# Patient Record
Sex: Female | Born: 1970 | Race: White | Hispanic: No | Marital: Married | Smoking: Heavy tobacco smoker
Health system: Southern US, Community
[De-identification: ages and names within clinical notes are randomized; demographics above are authoritative.]

## PROBLEM LIST (undated history)

## (undated) DIAGNOSIS — J449 Chronic obstructive pulmonary disease, unspecified: Secondary | ICD-10-CM

## (undated) DIAGNOSIS — J45909 Unspecified asthma, uncomplicated: Secondary | ICD-10-CM

## (undated) HISTORY — DX: Unspecified asthma, uncomplicated: J45.909

## (undated) HISTORY — DX: Chronic obstructive pulmonary disease, unspecified: J44.9

---

## 2001-01-05 ENCOUNTER — Emergency Department (HOSPITAL_COMMUNITY): Admission: EM | Admit: 2001-01-05 | Discharge: 2001-01-05 | Payer: Self-pay | Admitting: Emergency Medicine

## 2007-04-08 ENCOUNTER — Inpatient Hospital Stay (HOSPITAL_COMMUNITY): Admission: RE | Admit: 2007-04-08 | Discharge: 2007-04-15 | Payer: Self-pay | Admitting: Psychiatry

## 2007-04-08 ENCOUNTER — Ambulatory Visit: Payer: Self-pay | Admitting: Psychiatry

## 2010-04-20 ENCOUNTER — Encounter: Payer: Self-pay | Admitting: Obstetrics and Gynecology

## 2010-08-13 NOTE — H&P (Signed)
[name], Nicole Cohen NO.:  [id number]   MEDICAL RECORD NO.:  [id number]          PATIENT TYPE:  IPS   LOCATION:  0506                          FACILITY:  [facility]   PHYSICIAN:  Geoffery Lyons, M.D.      DATE OF BIRTH:  1971-02-18   DATE OF ADMISSION:  04/08/2007  DATE OF DISCHARGE:                       PSYCHIATRIC ADMISSION ASSESSMENT   IDENTIFYING DATA:  This is a 40 year old female who was voluntarily  admitted on April 08, 2007.   HISTORY OF PRESENT ILLNESS:  The patient presents with history of  depression including suicidal thoughts.  No specific plan.  The patient  has been using Ativan, Klonopin up to 15 to 20 pills daily.  She is  unclear of the milligrams and also has been drinking.  Her last use of  the benzodiazepines and alcohol was 6 days ago.  She states that she was  feeling very panicky about being evicted so she took the kids and ran  to [place], where she was staying with her best friend.  She was  very concerned about losing her home, again was having panic.  She  states that she was on a pill and alcohol binge.  States she did have  the kids with her but her friend who she states is a Engineer, civil (consulting), is watching  him.  She states her family was unaware of her whereabouts for  approximately 2 weeks.  States she has been drinking a lot up to six  bottles a liquor, drinking on a daily basis.  She has had trouble  sleeping; has lost approximately 15 pounds over the past 2 months.  Reports a history of alcohol use for years with her first drink at the  age of 52.  Her family pretty much has disowned her due to these events  and taking the children.   PAST PSYCHIATRIC HISTORY:  First admission to the [facility].  She reports a history of postpartum depression where she was  having symptoms of anxiety, feeling very sad and crying.  She reports a  history of suicide attempts 3 years ago where she cut her wrists after a  nervous breakdown.   She was being treated prior at [facility].   SOCIAL HISTORY:  She considers herself homeless at this time.  Has poor  social support.  She is a married female with 3 children ages 72, 1 and 65  months of age.   FAMILY HISTORY:  Father with alcohol problems but she has not seen her  father since childhood.  States feels her mother has history of  depression.  Her sister is bipolar.   ALCOHOL AND DRUG HISTORY:  As above.  Her first drink was at the age of  89.  Her longest of sobriety has during her pregnancy, although she did  state that she did drink on occasion during her pregnancy.  Primary care  Jacoria Keiffer goes to [facility].   MEDICAL PROBLEMS:  Cervical cancer, did receive some treatment.  Also  reporting problems with headaches.   MEDICATIONS:  Was prescribed Xanax and  Ativan which the patient states  she was taking like candy.   ALLERGIES:  DRUG ALLERGIES ARE ASPIRIN.   REVIEW OF SYSTEMS:  Physical exam, review of systems is significant for  a 15 pounds weight loss.  No seizures.  No falls, positive for  depression, anxiety and substance use.   PHYSICAL EXAMINATION:  VITAL SIGNS:  Temperature is 97.7, 88 heart rate,  20 respirations, blood pressure is 113/77.  She is 5 feet 2 inches tall  and 102 pounds.  GENERAL:  This is a thin female.  She appears in no acute physical  distress.  There are no tremors.  HEENT:  Her head is atraumatic.  NECK:  Negative lymphadenopathy.  CHEST:  Chest is clear, did have some mild rhonchi that clear with  cough.  BREASTS:  Breast exam was deferred.  HEART:  Heart regular rate and rhythm.  ABDOMEN:  Her abdomen is soft and flat.  PELVIC/GU:  Exam deferred.  EXTREMITIES:  She was moves all extremities.  No clubbing, no acute  edema.  No deformities.  Strong at 5+ against resistance.  SKIN:  Warm, no rashes or lacerations.  SKIN:  Skin assessment notes piercings.  NEUROLOGICAL:  Findings are intact and there are  no tics no tremors.   LABORATORY DATA:  TSH is 1.517, magnesium 2.1, __________  within normal  limits.  cm at within normal limits.  Salicylate less than 4.  Urine,  urine drug screen and pregnancy tests are pending.   MENTAL STATUS EXAM:  She is thin young female.  She is casually dressed  with fair eye contact.  Speech is clear, normal pace and tone.  The  patient's mood is depressed.  Her affect she is a tearful, cooperative,  sad.  Thought processes endorsing suicidal thoughts.  No hallucinations.  Her answers are goal directed.  Cognitive function intact.  Her memory  is good.  Judgment and insight poor.  Poor impulse control.   DIAGNOSES:  AXIS I:  Major depressive disorder, alcohol dependence,  benzoyl dependence.  AXIS II:  Deferred.  AXIS III:  Asthma.  AXIS IV:  Problems with primary support group, possible problems with  housing, economic issues, other psychosocial problems.  AXIS V:  Current is 35.   PLAN:  Stabilize mood and thinking, detox with Librium protocol.  Work  on relapse prevention.  Will assess comorbidities during her hospital  stay.  We will attempt to obtain the patient's prior psychotropic  medications, increase her coping skills, family session with husband if  husband is agreeable.  Case manager is looking any potential rehab  programs.  The patient is agreeable to the inpatient rehab at this time.  Her tentative length of stay is 4 to 5 days.      Landry Corporal, N.P.      Geoffery Lyons, M.D.  Electronically Signed    JO/MEDQ  D:  04/09/2007  T:  04/10/2007  Job:  [id number]

## 2010-08-16 NOTE — Discharge Summary (Signed)
NAMENICOL, USREY NO.:  [id number]   MEDICAL RECORD NO.:  [id number]          PATIENT TYPE:  IPS   LOCATION:  0506                          FACILITY:  [facility]   PHYSICIAN:  Geoffery Lyons, M.D.      DATE OF BIRTH:  1971-03-09   DATE OF ADMISSION:  04/08/2007  DATE OF DISCHARGE:  04/15/2007                               DISCHARGE SUMMARY   CHIEF COMPLAINT AND PRESENT ILLNESS:  This was the first admission to  [facility] for this 40 year old female voluntarily  admitted.  History of depression, including suicidal thoughts.  No  specific plan.  Had been using Ativan and Klonopin, up to 15-20 pills  per day.  She has also been drinking.  Last use of benzodiazepines and  alcohol was 6 days prior to this admission.  She had been feeling very  panicky about being evicted, so she took the kids and ran to [place], where she was staying with her best friend.  She was very  concerned about losing her home again and was having panic.  She was on  a pill and alcohol binge.  She did have the kids with her, but a friend  who she states is a Engineer, civil (consulting) was watching them.  Her family was unaware of  her whereabouts for 2 weeks.  She has been drinking a lot, up to 6  bottles of liquor, drinking on a daily basis.  Has had trouble sleeping,  lost 15 pounds over 2 months.  Alcohol for years, first drink at age 71.  Her family pretty much has disowned her due to these events and taking  the children.   PAST PSYCHIATRIC HISTORY:  First time at Behavior Health.  Has history  of postpartum depression, with anxiety, feeling very sad and crying.  Suicide attempt 3 years prior to this admission, where she cut her  wrist.  She was being treated at the [facility].   ALCOHOL AND DRUG HISTORY:  As already stated, first drink at age 71.  Longest sobriety during her pregnancy.   MEDICAL HISTORY:  Cervical cancer.   MEDICATIONS:  Was prescribed Xanax and Ativan, which she  admits she took  like candy.   PHYSICAL EXAMINATION:  Performed, failed to show any acute findings.   LABORATORY WORK:  White blood cells 10.2, hemoglobin 14.7, mean  corpuscular volume 83.8.  Sodium 138, potassium 3.7, glucose 92, BUN 11,  creatinine 0.69, SGOT 15, SGPT 11, TSH 1.517.   MENTAL STATUS EXAM:  Reveals an alert cooperative female, casually  dressed, fair eye contact.  Speech was clear, normal pace and tone.  Mood is anxious, depressed.  Affect anxious, depressed, very tearful.  Thought processes logical, coherent, and relevant.  Endorsed feeling  overwhelmed, hopeless, helpless.  Endorsed suicidal thoughts, but no  delusions.  No hallucinations.  Cognition well preserved.   ADMITTING DIAGNOSES:   AXIS I:  Major depressive disorder.  Alcohol dependence.  Benzodiazepine dependence.   AXIS II:  No diagnosis.   AXIS III:  Asthma.   AXIS IV:  Moderate.   AXIS  V:  On admission, 35.  Highest global assessment of function in the  last year 60.   COURSE IN THE HOSPITAL:  She was admitted and started individual and  group psychotherapy.  We detoxified her with Librium.  We gave her some  trazodone for sleep.  She was given Paxil, Robaxin, and placed on a  Lidoderm patch.  As already stated, endorsed that the depression got  really bad, got very depressed, had two babies back to back.  Through  the pregnancy depressed, but it got worse after she gave birth.  Increased depression, endorsed paranoia.  Endorsed she was going to her  house, so she took the kids to [place].  She was on a binge.  Monday of this week, got back to this area, drinking about 6 bottles of  liquor, using Ativan, Valium, Klonopin 15-20 pills.  Came home, crying,  decreased sleep.  Three years ago, she broke down, tried to kill  herself, cut her wrists.  Endorsed that there were issues with the  husband's family.  Started drinking at 50.  She stopped when she found  out she was pregnant.  Se  was in mental health 3 years prior to this  admission in [employer] for counseling and medications for panic.  Family session with the husband April 10, 2007.  The family was  currently homeless.  The husband was going to work on finding a place  for the whole family, even while he was staying with his family, but she  was not welcome there.  They were willing to pursue marital counseling.  On April 12, 2007, she continued to have a very hard time.  She was  not eating.  In the past, she has restricted her food intake.  Very  upset with the conflict, expressions of hostility coming from the  family, who continued to threaten to take the kids away from her.  No  appetite versus restricting, endorsed depressed, anxious mood, a sense  of hopelessness, helplessness, the feeling that things were not going to  get any better.  Dreaded being out there, the way she was feeling, some  suicidal ruminations.  We continued to work on Pharmacologist.  We worked  to stabilize her appetite.  She continued to deal with all the worries  and anxiety, the uncertainty of what was going to happen.  Did not have  a place to go.  Husband is safe with the children, but she is not  welcome there.  She endorsed that she realized she had to take it day by  day, as she could not anticipate what was going to happen, so she  started turning around.  She continued to work on herself on coping and  relapse prevention.  She got some contact with [facility], and she  felt that that was the best disposition for her, at least temporarily.  On April 15, 2007, she was in full contact reality.  There was no  active withdrawal, and no suicidal or homicidal ideas, no hallucinations  or delusions.  She was going to go to the halfway house.  Meanwhile, she  is going to continue to work on herself on recovery and her anxiety.  We  continued to work towards mending the relationship with her family.   DISCHARGE DIAGNOSES:    AXIS I:  Alcohol dependence.  Benzodiazepine dependence.  Major depressive disorder.  Anxiety disorder, not otherwise specified.   AXIS II:  No diagnosis.  AXIS III:  Asthma.  Back pain.   AXIS IV:  Moderate.   AXIS V:  Upon discharge, 50.   Discharged on:  1. Paxil 20 mg per day.  2. Lidoderm patch 5%, apply to area for 12 hours.  3. Trazodone 50 mg at bedtime as needed for sleep.  4. Ventolin inhaler 2 puffs q.4 h. as needed.  5. Robaxin 500 mg twice a day as needed.   Discharged to [facility].   FOLLOWUP:  With Dr. Lang Snow and [facility],      Geoffery Lyons, M.D.  Electronically Signed     IL/MEDQ  D:  05/02/2007  T:  05/03/2007  Job:  [id number]

## 2010-12-19 LAB — PREGNANCY, URINE: Preg Test, Ur: NEGATIVE

## 2010-12-19 LAB — URINALYSIS, ROUTINE W REFLEX MICROSCOPIC
Bilirubin Urine: NEGATIVE
Ketones, ur: NEGATIVE
Nitrite: NEGATIVE
Protein, ur: NEGATIVE
pH: 5.5

## 2010-12-19 LAB — COMPREHENSIVE METABOLIC PANEL
Albumin: 3.9
CO2: 29
Calcium: 9.7
Chloride: 101
GFR calc Af Amer: >60
Glucose, Bld: 92
Potassium: 3.7
Sodium: 138
Total Bilirubin: 0.6
Total Protein: 7.2

## 2010-12-19 LAB — ACETAMINOPHEN LEVEL: Acetaminophen (Tylenol), Serum: <10 — ABNORMAL LOW

## 2010-12-19 LAB — BENZODIAZEPINE, QUANTITATIVE, URINE
Alprazolam (GC/LC/MS), ur confirm: NEGATIVE
Flurazepam GC/MS Conf: NEGATIVE
Nordiazepam GC/MS Conf: 140 ng/mL
Oxazepam GC/MS Conf: 1800 ng/mL

## 2010-12-19 LAB — URINE DRUGS OF ABUSE SCREEN W ALC, ROUTINE (REF LAB)
Benzodiazepines.: POSITIVE — AB
Cocaine Metabolites: NEGATIVE
Ethyl Alcohol: <5
Marijuana Metabolite: NEGATIVE
Phencyclidine (PCP): NEGATIVE
Propoxyphene: NEGATIVE

## 2010-12-19 LAB — DIFFERENTIAL
Basophils Absolute: 0
Lymphocytes Relative: 24
Monocytes Absolute: 0.7
Monocytes Relative: 7
Neutro Abs: 6.7
Neutrophils Relative %: 66

## 2010-12-19 LAB — CBC
Hemoglobin: 14.7
MCHC: 34.1
RBC: 5.15 — ABNORMAL HIGH
WBC: 10.2

## 2010-12-19 LAB — URINE MICROSCOPIC-ADD ON

## 2012-07-29 ENCOUNTER — Other Ambulatory Visit: Payer: Self-pay | Admitting: *Deleted

## 2012-07-29 DIAGNOSIS — Z01810 Encounter for preprocedural cardiovascular examination: Secondary | ICD-10-CM

## 2012-08-02 NOTE — Addendum Note (Signed)
Addended by: Runell Gess on: 08/02/2012 06:36 AM   Modules accepted: Orders

## 2012-08-16 ENCOUNTER — Encounter: Payer: Self-pay | Admitting: Cardiovascular Disease

## 2012-08-18 ENCOUNTER — Encounter: Payer: Self-pay | Admitting: Cardiovascular Disease

## 2012-08-24 ENCOUNTER — Ambulatory Visit (HOSPITAL_COMMUNITY)
Admission: RE | Admit: 2012-08-24 | Discharge: 2012-08-24 | Disposition: A | Discharge status: Home / Self Care | Payer: Medicaid Other | Source: Ambulatory Visit | Attending: Cardiovascular Disease | Admitting: Cardiovascular Disease

## 2012-08-24 ENCOUNTER — Encounter (HOSPITAL_COMMUNITY)
Admission: RE | Disposition: A | Discharge status: Home / Self Care | Payer: Self-pay | Source: Ambulatory Visit | Attending: Cardiovascular Disease

## 2012-08-24 ENCOUNTER — Encounter (HOSPITAL_COMMUNITY): Payer: Self-pay | Admitting: Cardiology

## 2012-08-24 DIAGNOSIS — F17200 Nicotine dependence, unspecified, uncomplicated: Secondary | ICD-10-CM | POA: Insufficient documentation

## 2012-08-24 DIAGNOSIS — Z79899 Other long term (current) drug therapy: Secondary | ICD-10-CM | POA: Insufficient documentation

## 2012-08-24 DIAGNOSIS — E782 Mixed hyperlipidemia: Secondary | ICD-10-CM

## 2012-08-24 DIAGNOSIS — R0602 Shortness of breath: Secondary | ICD-10-CM | POA: Insufficient documentation

## 2012-08-24 DIAGNOSIS — Z7902 Long term (current) use of antithrombotics/antiplatelets: Secondary | ICD-10-CM | POA: Insufficient documentation

## 2012-08-24 DIAGNOSIS — J4489 Other specified chronic obstructive pulmonary disease: Secondary | ICD-10-CM | POA: Insufficient documentation

## 2012-08-24 DIAGNOSIS — R079 Chest pain, unspecified: Secondary | ICD-10-CM

## 2012-08-24 DIAGNOSIS — Z886 Allergy status to analgesic agent status: Secondary | ICD-10-CM | POA: Insufficient documentation

## 2012-08-24 DIAGNOSIS — Z720 Tobacco use: Secondary | ICD-10-CM

## 2012-08-24 DIAGNOSIS — E785 Hyperlipidemia, unspecified: Secondary | ICD-10-CM | POA: Insufficient documentation

## 2012-08-24 DIAGNOSIS — Z01810 Encounter for preprocedural cardiovascular examination: Secondary | ICD-10-CM

## 2012-08-24 DIAGNOSIS — J449 Chronic obstructive pulmonary disease, unspecified: Secondary | ICD-10-CM | POA: Insufficient documentation

## 2012-08-24 DIAGNOSIS — J441 Chronic obstructive pulmonary disease with (acute) exacerbation: Secondary | ICD-10-CM

## 2012-08-24 HISTORY — PX: LEFT HEART CATHETERIZATION WITH CORONARY ANGIOGRAM: SHX5451

## 2012-08-24 LAB — BASIC METABOLIC PANEL
CO2: 22 mEq/L (ref 19–32)
Chloride: 103 mEq/L (ref 96–112)
Glucose, Bld: 85 mg/dL (ref 70–99)
Potassium: 3.7 mEq/L (ref 3.5–5.1)
Sodium: 139 mEq/L (ref 135–145)

## 2012-08-24 LAB — HCG, SERUM, QUALITATIVE: Preg, Serum: NEGATIVE

## 2012-08-24 LAB — CBC
HCT: 39.2 % (ref 36.0–46.0)
Hemoglobin: 13.3 g/dL (ref 12.0–15.0)
MCH: 28.6 pg (ref 26.0–34.0)
MCV: 84.3 fL (ref 78.0–100.0)
Platelets: 202 10*3/uL (ref 150–400)
RBC: 4.65 MIL/uL (ref 3.87–5.11)
WBC: 7.7 10*3/uL (ref 4.0–10.5)

## 2012-08-24 SURGERY — LEFT HEART CATHETERIZATION WITH CORONARY ANGIOGRAM
Anesthesia: LOCAL

## 2012-08-24 MED ORDER — FENTANYL CITRATE 0.05 MG/ML IJ SOLN
INTRAMUSCULAR | Status: AC
  Filled 2012-08-24: qty 2

## 2012-08-24 MED ORDER — DIAZEPAM 5 MG PO TABS
ORAL_TABLET | ORAL | Status: AC
  Filled 2012-08-24: qty 1

## 2012-08-24 MED ORDER — MIDAZOLAM HCL 2 MG/2ML IJ SOLN
INTRAMUSCULAR | Status: AC
  Filled 2012-08-24: qty 2

## 2012-08-24 MED ORDER — SODIUM CHLORIDE 0.9 % IV SOLN: INTRAVENOUS | Status: DC

## 2012-08-24 MED ORDER — DIAZEPAM 5 MG PO TABS
5.0000 mg | ORAL_TABLET | ORAL | Status: AC
  Administered 2012-08-24: 5 mg via ORAL

## 2012-08-24 MED ORDER — ACETAMINOPHEN 325 MG PO TABS
ORAL_TABLET | ORAL | Status: AC
  Filled 2012-08-24: qty 2

## 2012-08-24 MED ORDER — ONDANSETRON HCL 4 MG/2ML IJ SOLN: 4.0000 mg | Freq: Four times a day (QID) | INTRAMUSCULAR | Status: DC | PRN

## 2012-08-24 MED ORDER — SODIUM CHLORIDE 0.9 % IJ SOLN: 3.0000 mL | INTRAMUSCULAR | Status: DC | PRN

## 2012-08-24 MED ORDER — HEPARIN (PORCINE) IN NACL 2-0.9 UNIT/ML-% IJ SOLN
INTRAMUSCULAR | Status: AC
  Filled 2012-08-24: qty 1000

## 2012-08-24 MED ORDER — LIDOCAINE HCL (PF) 1 % IJ SOLN
INTRAMUSCULAR | Status: AC
  Filled 2012-08-24: qty 30

## 2012-08-24 MED ORDER — ACETAMINOPHEN 325 MG PO TABS
650.0000 mg | ORAL_TABLET | ORAL | Status: DC | PRN
  Administered 2012-08-24: 650 mg via ORAL

## 2012-08-24 MED ORDER — SODIUM CHLORIDE 0.9 % IV SOLN
INTRAVENOUS | Status: DC
  Administered 2012-08-24: 12:00:00 via INTRAVENOUS

## 2012-08-24 NOTE — H&P (Signed)
Chief Complaint: Chest Pain   HPI: The patient is a 42 y/o Caucasian female, followed at [facility] by Dr. Stann Ore, who was referred to [facility] for a left heart catheterization in the setting of chest pain and shortness of breath x 2 months. Cardiac risk factors include a long history of tobacco abuse and HLD. She reports smoking 1-2 packs of cigaretts per day. She denies any family history of MI, CAD or SCD.  She also admits to a past history of heavy alcohol use x 15 years. 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 The patient reports intermittent, exertional left-sided chest tightness and shortness of breath on exertion for the past two months. The pain radiates to the left neck and left upper extremity, and is relieved with rest. She also endorses associated lightheadedness with these episodes. She denies n/v, diaphoresis and syncope. A recent echocardiogram, performed at Dr. Glendora Score office, demonstrated reduced systolic function, with an EF of 40%. No wall motion abnormalities were noted. She states that her last episode of chest pain was 2 days ago. She denies recent fever, chills, diarrhea, n/v, hematuria, melena and hematochezia. She now presents for a diagnostic left heart cath to define her coronaries.   No past medical history on file.  No past surgical history on file.  Family History: No known Family Hx of CAD, MI or SCD  Social History:  reports that she has been smoking Cigarettes.  She has a 37.5 pack-year smoking history. She does not have any smokeless tobacco history on file. She reports that she does not drink alcohol. She has a past history of heavy alcohol abuse x 15 years. She reports that she has been sober for the last 5 years.   Allergies:  Allergies  Allergen Reactions  . Aspirin Itching and Nausea And Vomiting    Medications Prior to Admission  Medication Sig Dispense Refill  . albuterol (PROVENTIL) (2.5 MG/3ML) 0.083% nebulizer solution Take 2.5  mg by nebulization every 6 (six) hours as needed for wheezing.      . carvedilol (COREG) 3.125 MG tablet Take 3.125 mg by mouth 2 (two) times daily with a meal.      . clopidogrel (PLAVIX) 75 MG tablet Take 75 mg by mouth daily.      . cyclobenzaprine (FLEXERIL) 10 MG tablet Take 10 mg by mouth 3 (three) times daily as needed for muscle spasms.      . DULoxetine (CYMBALTA) 30 MG capsule Take 90 mg by mouth daily.      . Fluticasone-Salmeterol (ADVAIR) 250-50 MCG/DOSE AEPB Inhale 1 puff into the lungs every 12 (twelve) hours.      . Linaclotide (LINZESS) 290 MCG CAPS Take 1 capsule by mouth daily.      . mometasone (NASONEX) 50 MCG/ACT nasal spray Place 2 sprays into the nose daily as needed. Allergies.      . montelukast (SINGULAIR) 10 MG tablet Take 10 mg by mouth at bedtime.      . pregabalin (LYRICA) 300 MG capsule Take 300 mg by mouth 2 (two) times daily.      . simvastatin (ZOCOR) 20 MG tablet Take 20 mg by mouth every evening.        Results for orders placed during the hospital encounter of 08/24/12 (from the past 48 hour(s))  CBC     Status: None   Collection Time    08/24/12 12:01 PM      Result Value Range   WBC  7.7  4.0 - 10.5 K/uL   RBC 4.65  3.87 - 5.11 MIL/uL   Hemoglobin 13.3  12.0 - 15.0 g/dL   HCT 10.2  72.5 - 36.6 %   MCV 84.3  78.0 - 100.0 fL   MCH 28.6  26.0 - 34.0 pg   MCHC 33.9  30.0 - 36.0 g/dL   RDW 44.0  34.7 - 42.5 %   Platelets 202  150 - 400 K/uL  PROTIME-INR     Status: None   Collection Time    08/24/12 12:01 PM      Result Value Range   Prothrombin Time 12.4  11.6 - 15.2 seconds   INR 0.93  0.00 - 1.49   No results found.  Review of Systems  Constitutional: Negative for fever, chills and diaphoresis.  HENT: Positive for neck pain. Negative for sore throat.   Eyes: Negative for blurred vision and double vision.  Respiratory: Positive for shortness of breath and wheezing. Negative for cough.   Cardiovascular: Positive for chest pain, orthopnea,  claudication and leg swelling. Negative for palpitations and PND.  Gastrointestinal: Negative for heartburn, nausea, vomiting, abdominal pain, diarrhea, blood in stool and melena.  Genitourinary: Negative for dysuria and hematuria.  Musculoskeletal: Negative for falls.  Neurological: Positive for dizziness. Negative for loss of consciousness, weakness and headaches.    Blood pressure 101/63, pulse 99, temperature 97.2 F (36.2 C), temperature source Oral, height 5\' 1"  (1.549 m), weight 132 lb (59.875 kg). Physical Exam  Constitutional: She is oriented to person, place, and time. She appears well-developed and well-nourished. No distress.  HENT:  Head: Normocephalic and atraumatic.  Eyes: Conjunctivae and EOM are normal. Pupils are equal, round, and reactive to light.  Neck: No JVD present. Carotid bruit is not present. No thyromegaly present.  Cardiovascular: Normal rate, regular rhythm, normal heart sounds and intact distal pulses.  Exam reveals no gallop and no friction rub.   No murmur heard. Pulses:      Radial pulses are 2+ on the right side, and 2+ on the left side.       Dorsalis pedis pulses are 2+ on the right side, and 2+ on the left side.  Respiratory: Effort normal. No respiratory distress. She has wheezes. She has no rales. She exhibits no tenderness.  GI: Soft. Bowel sounds are normal. She exhibits no distension and no mass. There is no tenderness. There is no guarding.  Musculoskeletal: Normal range of motion. She exhibits no edema.  Lymphadenopathy:    She has no cervical adenopathy.  Neurological: She is alert and oriented to person, place, and time.  Skin: Skin is warm and dry. She is not diaphoretic.  Psychiatric: She has a normal mood and affect. Her behavior is normal.     Assessment/Plan Principal Problem:   Chest pain with moderate risk for cardiac etiology Active Problems:   HLD (hyperlipidemia)   COPD (chronic obstructive pulmonary disease)  Plan: Exam  benign. Labs stable. INR is 0.93. HR and BP both stable. No current CP. Plan for diagnostic LHC with Dr. Allyson Sabal today.  Note: No femoral bruits on pre-cath exam  Allayne Butcher, PA-C 08/24/2012, 12:33 PM

## 2012-08-24 NOTE — H&P (Signed)
    Pt was reexamined and existing H & P reviewed. No changes found.  Runell Gess, MD [facility] 08/24/2012 1:40 PM

## 2012-08-24 NOTE — CV Procedure (Signed)
Nicole Cohen is a 42 y.o. female    [id number] LOCATION:  FACILITY: [facility]  PHYSICIAN: Nanetta Batty, M.D. 20-Sep-1970   DATE OF PROCEDURE:  08/24/2012  DATE OF DISCHARGE:  [facility]  CARDIAC CATHETERIZATION     History obtained from chart review. The patient is a 42 y/o Caucasian female, followed at [facility] by Dr. Stann Ore, who was referred to [facility] for a left heart catheterization in the setting of chest pain and shortness of breath x 2 months. Cardiac risk factors include a long history of tobacco abuse and HLD. She reports smoking 1-2 packs of cigaretts per day. She denies any family history of MI, CAD or SCD. She also admits to a past history of heavy alcohol use x 15 years. 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 The patient reports intermittent, exertional left-sided chest tightness and shortness of breath on exertion for the past two months. The pain radiates to the left neck and left upper extremity, and is relieved with rest. She also endorses associated lightheadedness with these episodes. She denies n/v, diaphoresis and syncope. A recent echocardiogram, performed at Dr. Glendora Score office, demonstrated reduced systolic function, with an EF of 40%. No wall motion abnormalities were noted. She states that her last episode of chest pain was 2 days ago. She denies recent fever, chills, diarrhea, n/v, hematuria, melena and hematochezia. She now presents for a diagnostic left heart cath to define her coronaries.     PROCEDURE DESCRIPTION:    The patient was brought to the second floor [facility] cath lab in the postabsorptive state. She was premedicated with Valium 5 mg by mouth, IV Versed and fentanyl.. Her right groin was prepped and shaved in usual sterile fashion. Xylocaine 1% was used for local anesthesia. A 5 French sheath was inserted into the right common femoral  artery using standard Seldinger technique. 5 French  right and left Judkins diagnostic catheters along with a 5 French pigtail catheter was used for selective coronary angiography and left ventriculography respectively. Visipaque dye was used for the entirety of the case. Retrograde aortic, left ventricular and pull back pressures were recorded.   HEMODYNAMICS:    AO SYSTOLIC/AO DIASTOLIC: 100/68   LV SYSTOLIC/LV DIASTOLIC: 103/8  ANGIOGRAPHIC RESULTS:   1. Left main; normal  2. LAD; normal 3. Left circumflex; nondominant and normal.  4. Right coronary artery; dominant and normal 5. Left ventriculography; RAO left ventriculogram was performed using  25 mL of Visipaque dye at 12 mL/second. The overall LVEF estimated  55 % Without wall motion abnormalities  IMPRESSION:Nicole Cohen has normal coronary arteries and normal left ventricular function. I believe her chest pain is noncardiac. She does have fibromyalgia. The SGOT was performed of the right common femoral artery and a "MYNX " femoral closure device was placed with excellent hemostasis. The patient left the Cath Lab in stable condition. She will be discharged home after remaining recumbent for 2 hours and will followup with Dr. Hanley Hays.  Runell Gess MD, [facility] 08/24/2012 2:15 PM

## 2012-08-30 ENCOUNTER — Ambulatory Visit (INDEPENDENT_AMBULATORY_CARE_PROVIDER_SITE_OTHER): Payer: Medicaid Other | Admitting: Physician Assistant

## 2012-08-30 ENCOUNTER — Ambulatory Visit (HOSPITAL_COMMUNITY)
Admission: RE | Admit: 2012-08-30 | Discharge: 2012-08-30 | Disposition: A | Discharge status: Home / Self Care | Payer: Medicaid Other | Source: Ambulatory Visit | Attending: Cardiovascular Disease | Admitting: Cardiovascular Disease

## 2012-08-30 ENCOUNTER — Encounter: Payer: Self-pay | Admitting: Physician Assistant

## 2012-08-30 ENCOUNTER — Telehealth: Payer: Self-pay | Admitting: Cardiovascular Disease

## 2012-08-30 VITALS — BP 104/76 | HR 88 | Ht 61.0 in | Wt 134.8 lb

## 2012-08-30 DIAGNOSIS — Z9889 Other specified postprocedural states: Secondary | ICD-10-CM

## 2012-08-30 DIAGNOSIS — R109 Unspecified abdominal pain: Secondary | ICD-10-CM | POA: Insufficient documentation

## 2012-08-30 DIAGNOSIS — E785 Hyperlipidemia, unspecified: Secondary | ICD-10-CM

## 2012-08-30 DIAGNOSIS — J449 Chronic obstructive pulmonary disease, unspecified: Secondary | ICD-10-CM

## 2012-08-30 DIAGNOSIS — M7989 Other specified soft tissue disorders: Secondary | ICD-10-CM

## 2012-08-30 DIAGNOSIS — M79609 Pain in unspecified limb: Secondary | ICD-10-CM

## 2012-08-30 DIAGNOSIS — R1031 Right lower quadrant pain: Secondary | ICD-10-CM | POA: Insufficient documentation

## 2012-08-30 NOTE — Telephone Encounter (Signed)
Returned call.  Left message to call back before 4pm.  

## 2012-08-30 NOTE — Telephone Encounter (Signed)
Returned call.  Pt stated she had a cardiac cath done last Thursday.  Stated they put a closure device in and the incision site is swollen about the size of a small golf ball.  Also c/o pain in her thigh.  Stated she had it done last Tuesday, but is really starting to bother her.  Pain rated 5/10 now on pain scale.  C/o tenderness to touch.  Stated it is bruised and swollen at the incision site and the bruising looks abnormal.  Stated she is able to move toes and is able to walk, but doesn't want to bear weight.  Denied warmness, fever or chills.  Consulted w/ B. Hager, PA-C and advised pt come in today at 2pm for evaluation.  Pt informed and verbalized understanding.  Pt agreed w/ plan.  Address given to office.

## 2012-08-30 NOTE — Telephone Encounter (Signed)
Returning your call. °

## 2012-08-30 NOTE — Progress Notes (Signed)
Right lower ext. Arterial limited Duplex Completed. No evidence of pseudoaneurysm noted. Marilynne Halsted, RDMS, RVT

## 2012-08-30 NOTE — Assessment & Plan Note (Signed)
Currently being treated with simvastatin and followed at [facility]

## 2012-08-30 NOTE — Telephone Encounter (Signed)
Had a procedure on 5-27-Area is swollen and real painful also bruised-Please Call!

## 2012-08-30 NOTE — Assessment & Plan Note (Addendum)
Patient had progressively worsening right groin pain since her catheterization last week.  Study revealed normal coronary arteries so we'll stop the Plavix. She will be taken for a right groin vascular ultrasound to look for pseudoaneurysm.   Depending on results of the [place], she may need to be admitted for compression.

## 2012-08-30 NOTE — Progress Notes (Signed)
Date:  08/30/2012   ID:  Nicole Cohen, DOB [birth year], MRN [id number]  PCP:  Pcp Not In System  Primary Cardiologist:  Hanley Hays    History of Present Illness: Nicole Cohen is a 42 y.o. female  followed at [facility] by Dr. Hanley Hays, who was referred to [facility] for a left heart catheterization in the setting of chest pain and shortness of breath x 2 months. Cardiac risk factors include a long history of tobacco abuse and HLD. She reports smoking 1-2 packs of cigaretts per day. She denies any family history of MI, CAD or SCD. She also admits to a past history of heavy alcohol use x 15 years. 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 The patient reporeds intermittent, exertional left-sided chest tightness and shortness of breath on exertion for the past two months. The pain radiated to the left neck and left upper extremity, and is relieved with rest. She also endorses associated lightheadedness with these episodes. She denied n/v, diaphoresis and syncope. A recent echocardiogram, performed at Dr. Glendora Score office, demonstrated reduced systolic function, with an EF of 40%. No wall motion abnormalities were noted.   Patient was referred for left heart catheterization which was completed on May 27 by Dr. Allyson Sabal. The procedure revealed normal coronary arteries and ejection fraction estimated at 55% without wall motion abnormalities. Patient's groin was closed with a mynxs closure device.  She presents with worsening right groin pain. She denies nausea, vomiting, fever, chest pain, shortness of breath.   Wt Readings from Last 3 Encounters:  08/30/12 134 lb 12.8 oz (61.145 kg)  08/24/12 132 lb (59.875 kg)  08/24/12 132 lb (59.875 kg)     History reviewed. No pertinent past medical history.  Current Outpatient Prescriptions  Medication Sig Dispense Refill  . albuterol (PROVENTIL) (2.5 MG/3ML) 0.083% nebulizer solution Take 2.5 mg by nebulization every 6 (six) hours as needed for  wheezing.      . carvedilol (COREG) 3.125 MG tablet Take 3.125 mg by mouth 2 (two) times daily with a meal.      . clopidogrel (PLAVIX) 75 MG tablet Take 75 mg by mouth daily.      . cyclobenzaprine (FLEXERIL) 10 MG tablet Take 10 mg by mouth 3 (three) times daily as needed for muscle spasms.      . DULoxetine (CYMBALTA) 30 MG capsule TAKE 60 MG AND 30 MG TABLETS BY MOUTH IN MORNING      . Fluticasone-Salmeterol (ADVAIR) 250-50 MCG/DOSE AEPB Inhale 1 puff into the lungs every 12 (twelve) hours.      . Linaclotide (LINZESS) 290 MCG CAPS Take 1 capsule by mouth daily.      . mometasone (NASONEX) 50 MCG/ACT nasal spray Place 2 sprays into the nose daily as needed. Allergies.      . montelukast (SINGULAIR) 10 MG tablet Take 10 mg by mouth at bedtime as needed.       . pregabalin (LYRICA) 300 MG capsule Take 300 mg by mouth 2 (two) times daily.      . simvastatin (ZOCOR) 20 MG tablet Take 20 mg by mouth every evening.       No current facility-administered medications for this visit.    Allergies:    Allergies  Allergen Reactions  . Aspirin Itching and Nausea And Vomiting    Social History:  The patient  reports that she has been smoking Cigarettes.  She has a 25 pack-year smoking history. She does not have any  smokeless tobacco history on file. She reports that she does not drink alcohol.   ROS:  Please see the history of present illness.  All other systems reviewed and negative.   PHYSICAL EXAM: VS:  BP 104/76  Pulse 88  Ht 5\' 1"  (1.549 m)  Wt 134 lb 12.8 oz (61.145 kg)  BMI 25.48 kg/m2 Well nourished, well developed, in no acute distress HEENT: Pupils are equal round react to light accommodation extraocular movements are intact.  Neck: no JVDNo cervical lymphadenopathy. Cardiac: Regular rate and rhythm without murmurs rubs or gallops. Lungs:  clear to auscultation bilaterally, no wheezing, rhonchi or rales Ext: no lower extremity edema.  2+ radial and dorsalis pedis and PT  pulses. Skin: warm and dry.  Moderate-sized hematoma in the right groin some of which is probably actually the material from the [employer].  No appreciable bruit. Groin is very tender  Neuro:  Grossly normal    ASSESSMENT AND PLAN:  Problem List Items Addressed This Visit   HLD (hyperlipidemia)     Currently being treated with simvastatin and followed at [facility]    COPD (chronic obstructive pulmonary disease)   Severe right groin pain:  Concern is for pseudoaneurysm after left heart catheterization.     Patient had progressively worsening right groin pain since her catheterization last week.  Study revealed normal coronary arteries so we'll stop the Plavix. She will be taken for a right groin vascular ultrasound to look for pseudoaneurysm.   Depending on results of the [place], she may need to be admitted for compression.     Other Visit Diagnoses   Right leg swelling    -  Primary    Relevant Orders       Lower Extremity Arterial Duplex Right    Status post cardiac catheterization        Relevant Orders       Lower Extremity Arterial Duplex Right

## 2012-09-14 ENCOUNTER — Encounter: Payer: Self-pay | Admitting: *Deleted

## 2014-03-09 ENCOUNTER — Encounter (HOSPITAL_COMMUNITY): Payer: Self-pay | Admitting: Cardiovascular Disease

## 2017-12-27 ENCOUNTER — Emergency Department (HOSPITAL_COMMUNITY): Payer: Medicaid Other

## 2017-12-27 ENCOUNTER — Emergency Department (HOSPITAL_COMMUNITY)
Admission: EM | Admit: 2017-12-27 | Discharge: 2017-12-27 | Disposition: A | Discharge status: Home / Self Care | Payer: Medicaid Other | Attending: Emergency Medicine | Admitting: Emergency Medicine

## 2017-12-27 ENCOUNTER — Encounter (HOSPITAL_COMMUNITY): Payer: Self-pay | Admitting: Emergency Medicine

## 2017-12-27 ENCOUNTER — Other Ambulatory Visit: Payer: Self-pay

## 2017-12-27 DIAGNOSIS — J449 Chronic obstructive pulmonary disease, unspecified: Secondary | ICD-10-CM | POA: Diagnosis not present

## 2017-12-27 DIAGNOSIS — Z79899 Other long term (current) drug therapy: Secondary | ICD-10-CM | POA: Insufficient documentation

## 2017-12-27 DIAGNOSIS — R079 Chest pain, unspecified: Secondary | ICD-10-CM | POA: Diagnosis not present

## 2017-12-27 DIAGNOSIS — J209 Acute bronchitis, unspecified: Secondary | ICD-10-CM | POA: Insufficient documentation

## 2017-12-27 DIAGNOSIS — R531 Weakness: Secondary | ICD-10-CM | POA: Diagnosis present

## 2017-12-27 DIAGNOSIS — F17210 Nicotine dependence, cigarettes, uncomplicated: Secondary | ICD-10-CM | POA: Diagnosis not present

## 2017-12-27 DIAGNOSIS — Z720 Tobacco use: Secondary | ICD-10-CM

## 2017-12-27 DIAGNOSIS — R0789 Other chest pain: Secondary | ICD-10-CM

## 2017-12-27 LAB — URINALYSIS, ROUTINE W REFLEX MICROSCOPIC
Bilirubin Urine: NEGATIVE
GLUCOSE, UA: NEGATIVE mg/dL
Hgb urine dipstick: NEGATIVE
KETONES UR: NEGATIVE mg/dL
LEUKOCYTES UA: NEGATIVE
Nitrite: NEGATIVE
PH: 6 (ref 5.0–8.0)
Protein, ur: NEGATIVE mg/dL
SPECIFIC GRAVITY, URINE: 1.008 (ref 1.005–1.030)

## 2017-12-27 LAB — BASIC METABOLIC PANEL
Anion gap: 9 (ref 5–15)
BUN: 5 mg/dL — AB (ref 6–20)
CO2: 23 mmol/L (ref 22–32)
CREATININE: 0.74 mg/dL (ref 0.44–1.00)
Calcium: 9.2 mg/dL (ref 8.9–10.3)
Chloride: 104 mmol/L (ref 98–111)
GFR calc Af Amer: >60 mL/min (ref >60)
GFR calc non Af Amer: >60 mL/min (ref >60)
GLUCOSE: 112 mg/dL — AB (ref 70–99)
POTASSIUM: 3.5 mmol/L (ref 3.5–5.1)
SODIUM: 136 mmol/L (ref 135–145)

## 2017-12-27 LAB — CBC
HEMATOCRIT: 47.2 % — AB (ref 36.0–46.0)
Hemoglobin: 15.5 g/dL — ABNORMAL HIGH (ref 12.0–15.0)
MCH: 29.3 pg (ref 26.0–34.0)
MCHC: 32.8 g/dL (ref 30.0–36.0)
MCV: 89.2 fL (ref 78.0–100.0)
PLATELETS: 295 10*3/uL (ref 150–400)
RBC: 5.29 MIL/uL — ABNORMAL HIGH (ref 3.87–5.11)
RDW: 12.3 % (ref 11.5–15.5)
WBC: 5.3 10*3/uL (ref 4.0–10.5)

## 2017-12-27 LAB — I-STAT TROPONIN, ED: Troponin i, poc: 0 ng/mL (ref 0.00–0.08)

## 2017-12-27 LAB — PREGNANCY, URINE: Preg Test, Ur: NEGATIVE

## 2017-12-27 LAB — D-DIMER, QUANTITATIVE (NOT AT ARMC): D DIMER QUANT: 0.33 ug{FEU}/mL (ref 0.00–0.50)

## 2017-12-27 MED ORDER — FENTANYL CITRATE (PF) 100 MCG/2ML IJ SOLN
50.0000 ug | Freq: Once | INTRAMUSCULAR | Status: AC
  Administered 2017-12-27: 50 ug via INTRAVENOUS
  Filled 2017-12-27: qty 2

## 2017-12-27 MED ORDER — AZITHROMYCIN 250 MG PO TABS
500.0000 mg | ORAL_TABLET | Freq: Once | ORAL | Status: AC
  Administered 2017-12-27: 500 mg via ORAL
  Filled 2017-12-27: qty 2

## 2017-12-27 MED ORDER — AEROCHAMBER PLUS FLO-VU LARGE MISC
1.0000 | Freq: Once | Status: AC
  Administered 2017-12-27: 1

## 2017-12-27 MED ORDER — IPRATROPIUM-ALBUTEROL 0.5-2.5 (3) MG/3ML IN SOLN
3.0000 mL | Freq: Once | RESPIRATORY_TRACT | Status: AC
  Administered 2017-12-27: 3 mL via RESPIRATORY_TRACT
  Filled 2017-12-27: qty 3

## 2017-12-27 MED ORDER — HYDROCODONE-ACETAMINOPHEN 5-325 MG PO TABS: 1.0000 | ORAL_TABLET | ORAL | 0 refills | Status: DC | PRN

## 2017-12-27 MED ORDER — ALBUTEROL SULFATE (2.5 MG/3ML) 0.083% IN NEBU: 2.5000 mg | INHALATION_SOLUTION | Freq: Four times a day (QID) | RESPIRATORY_TRACT | 0 refills | Status: DC | PRN

## 2017-12-27 MED ORDER — ONDANSETRON HCL 4 MG/2ML IJ SOLN
4.0000 mg | Freq: Once | INTRAMUSCULAR | Status: AC
  Administered 2017-12-27: 4 mg via INTRAVENOUS
  Filled 2017-12-27: qty 2

## 2017-12-27 MED ORDER — SODIUM CHLORIDE 0.9 % IV BOLUS
1000.0000 mL | Freq: Once | INTRAVENOUS | Status: AC
  Administered 2017-12-27: 1000 mL via INTRAVENOUS

## 2017-12-27 MED ORDER — AEROCHAMBER PLUS FLO-VU LARGE MISC
Status: AC
  Filled 2017-12-27: qty 1

## 2017-12-27 MED ORDER — AZITHROMYCIN 250 MG PO TABS: ORAL_TABLET | ORAL | 0 refills | Status: DC

## 2017-12-27 MED ORDER — MORPHINE SULFATE (PF) 4 MG/ML IV SOLN
4.0000 mg | Freq: Once | INTRAVENOUS | Status: AC
  Administered 2017-12-27: 4 mg via INTRAVENOUS
  Filled 2017-12-27: qty 1

## 2017-12-27 MED ORDER — METHYLPREDNISOLONE SODIUM SUCC 125 MG IJ SOLR
125.0000 mg | Freq: Once | INTRAMUSCULAR | Status: AC
  Administered 2017-12-27: 125 mg via INTRAVENOUS
  Filled 2017-12-27: qty 2

## 2017-12-27 MED ORDER — ALBUTEROL SULFATE HFA 108 (90 BASE) MCG/ACT IN AERS
1.0000 | INHALATION_SPRAY | RESPIRATORY_TRACT | Status: DC | PRN
  Administered 2017-12-27: 2 via RESPIRATORY_TRACT
  Filled 2017-12-27: qty 6.7

## 2017-12-27 MED ORDER — PREDNISONE 10 MG (21) PO TBPK: 10.0000 mg | ORAL_TABLET | Freq: Every day | ORAL | 0 refills | Status: DC

## 2017-12-27 MED ORDER — SODIUM CHLORIDE 0.9 % IV SOLN
1.0000 g | Freq: Once | INTRAVENOUS | Status: AC
  Administered 2017-12-27: 1 g via INTRAVENOUS
  Filled 2017-12-27: qty 10

## 2017-12-27 NOTE — ED Triage Notes (Signed)
States she hasn't been feeling good for the past several days.  C/o generalized weakness, heart racing, L sided sharp and dull chest pain with deep inspiration, SOB, and pain to bilateral ears since this morning.

## 2017-12-27 NOTE — Discharge Instructions (Signed)
Try to stop smoking. °

## 2017-12-27 NOTE — ED Provider Notes (Addendum)
[facility] EMERGENCY DEPARTMENT Provider Note   CSN: [id number] Arrival date & time: 12/27/17  1516     History   Chief Complaint Chief Complaint  Patient presents with  . Tachycardia  . Weakness    HPI Nicole Cohen is a 47 y.o. female.  Pt presents to the ED today with weakness, sob.  The pt has also felt like her heart has been racing.  She said she's had a fever intermittently.  She's been coughing.  She has used her albuterol nebs, but they have not been helping.  The pt said she has not had an appetite.  She does smoke, but has been cutting down.     Past Medical History:  Diagnosis Date  . Asthma   . COPD (chronic obstructive pulmonary disease) [facility])     Patient Active Problem List   Diagnosis Date Noted  . Severe right groin pain:  Concern is for pseudoaneurysm after left heart catheterization. 08/30/2012  . Chest pain with moderate risk for cardiac etiology 08/24/2012  . HLD (hyperlipidemia) 08/24/2012  . COPD (chronic obstructive pulmonary disease) (HCC) 08/24/2012  . Tobacco abuse 08/24/2012    Past Surgical History:  Procedure Laterality Date  . LEFT HEART CATHETERIZATION WITH CORONARY ANGIOGRAM N/A 08/24/2012   Procedure: LEFT HEART CATHETERIZATION WITH CORONARY ANGIOGRAM;  Surgeon: Runell Gess, MD;  Location: [facility] CATH LAB;  Service: Cardiovascular;  Laterality: N/A;     OB History   None      Home Medications    Prior to Admission medications   Medication Sig Start Date End Date Taking? Authorizing Provider  arformoterol (BROVANA) 15 MCG/2ML NEBU Take 15 mcg by nebulization 2 (two) times daily as needed (SOB).   Yes [provider]  naproxen sodium (ALEVE) 220 MG tablet Take 440 mg by mouth 2 (two) times daily as needed (pain).   Yes [provider]  Probiotic Product (PROBIOTIC PO) Take 1 tablet by mouth daily.   Yes [provider]  albuterol (PROVENTIL) (2.5 MG/3ML) 0.083% nebulizer  solution Take 3 mLs (2.5 mg total) by nebulization every 6 (six) hours as needed for wheezing. 12/27/17   Jacalyn Lefevre, MD  azithromycin (ZITHROMAX) 250 MG tablet Take 1 every day until finished. 12/27/17   Jacalyn Lefevre, MD  HYDROcodone-acetaminophen (NORCO/VICODIN) 5-325 MG tablet Take 1 tablet by mouth every 4 (four) hours as needed. 12/27/17   Jacalyn Lefevre, MD  predniSONE (STERAPRED UNI-PAK 21 TAB) 10 MG (21) TBPK tablet Take 1 tablet (10 mg total) by mouth daily. Take 6 tabs by mouth daily  for 2 days, then 5 tabs for 2 days, then 4 tabs for 2 days, then 3 tabs for 2 days, 2 tabs for 2 days, then 1 tab by mouth daily for 2 days 12/27/17   Jacalyn Lefevre, MD    Family History No family history on file.  Social History Social History   Tobacco Use  . Smoking status: Heavy Tobacco Smoker    Packs/day: 1.00    Years: 25.00    Pack years: 25.00    Types: Cigarettes  . Smokeless tobacco: Never Used  Substance Use Topics  . Alcohol use: No    Comment: Past history of heavy alcohol abuse. Sober x 5 years  . Drug use: Not Currently     Allergies   Aspirin   Review of Systems Review of Systems  Respiratory: Positive for cough, shortness of breath and wheezing.   Cardiovascular: Positive for chest pain.  Neurological: Positive for weakness.  All other systems reviewed and are negative.    Physical Exam Updated Vital Signs BP 113/82   Pulse 68   Temp 97.9 F (36.6 C) (Oral)   Resp 17   Ht 5\' 1"  (1.549 m)   Wt 54.4 kg   SpO2 97%   BMI 22.67 kg/m   Physical Exam  Constitutional: She is oriented to person, place, and time. She appears well-developed and well-nourished.  HENT:  Head: Normocephalic and atraumatic.  Right Ear: External ear normal.  Left Ear: External ear normal.  Nose: Nose normal.  Mouth/Throat: Oropharynx is clear and moist.  Eyes: Pupils are equal, round, and reactive to light. Conjunctivae and EOM are normal.  Neck: Normal range of motion.  Neck supple.  Cardiovascular: Normal rate, regular rhythm, normal heart sounds and intact distal pulses.  Pulmonary/Chest: She has wheezes.  Abdominal: Soft. Bowel sounds are normal.  Musculoskeletal: Normal range of motion.  Neurological: She is alert and oriented to person, place, and time.  Skin: Skin is warm. Capillary refill takes less than 2 seconds.  Psychiatric: She has a normal mood and affect. Her behavior is normal. Judgment and thought content normal.  Nursing note and vitals reviewed.    ED Treatments / Results  Labs (all labs ordered are listed, but only abnormal results are displayed) Labs Reviewed  BASIC METABOLIC PANEL - Abnormal; Notable for the following components:      Result Value   Glucose, Bld 112 (*)    BUN 5 (*)    All other components within normal limits  CBC - Abnormal; Notable for the following components:   RBC 5.29 (*)    Hemoglobin 15.5 (*)    HCT 47.2 (*)    All other components within normal limits  D-DIMER, QUANTITATIVE (NOT AT [facility])  URINALYSIS, ROUTINE W REFLEX MICROSCOPIC  PREGNANCY, URINE  I-STAT TROPONIN, ED    EKG EKG Interpretation  Date/Time:  Sunday December 27 2017 15:33:55 EDT Ventricular Rate:  96 PR Interval:  136 QRS Duration: 80 QT Interval:  352 QTC Calculation: 444 R Axis:   87 Text Interpretation:  Sinus rhythm with marked sinus arrhythmia Otherwise normal ECG No significant change since last tracing Confirmed by Jacalyn Lefevre [phone]) on 12/27/2017 8:54:15 PM   Radiology Dg Chest 2 View  Result Date: 12/27/2017 CLINICAL DATA:  Chest pain EXAM: CHEST - 2 VIEW COMPARISON:  09/27/2012 chest radiograph. FINDINGS: Stable cardiomediastinal silhouette with normal heart size. No pneumothorax. No pleural effusion. Lungs appear clear, with no acute consolidative airspace disease and no pulmonary edema. IMPRESSION: No active cardiopulmonary disease. Electronically Signed   By: Delbert Phenix M.D.   On: 12/27/2017 17:32     Procedures Procedures (including critical care time)  Medications Ordered in ED Medications  albuterol (PROVENTIL HFA;VENTOLIN HFA) 108 (90 Base) MCG/ACT inhaler 1-2 puff (has no administration in time range)  AEROCHAMBER PLUS FLO-VU LARGE MISC 1 each (has no administration in time range)  AEROCHAMBER PLUS FLO-VU LARGE MISC (has no administration in time range)  sodium chloride 0.9 % bolus 1,000 mL (0 mLs Intravenous Stopped 12/27/17 2234)  morphine 4 MG/ML injection 4 mg (4 mg Intravenous Given 12/27/17 2117)  ondansetron (ZOFRAN) injection 4 mg (4 mg Intravenous Given 12/27/17 2116)  methylPREDNISolone sodium succinate (SOLU-MEDROL) 125 mg/2 mL injection 125 mg (125 mg Intravenous Given 12/27/17 2116)  ipratropium-albuterol (DUONEB) 0.5-2.5 (3) MG/3ML nebulizer solution 3 mL (3 mLs Nebulization Given 12/27/17 2117)  fentaNYL (SUBLIMAZE) injection 50 mcg (  50 mcg Intravenous Given 12/27/17 2233)  cefTRIAXone (ROCEPHIN) 1 g in sodium chloride 0.9 % 100 mL IVPB (1 g Intravenous New Bag/Given 12/27/17 2232)  azithromycin (ZITHROMAX) tablet 500 mg (500 mg Oral Given 12/27/17 2233)     Initial Impression / Assessment and Plan / ED Course  I have reviewed the triage vital signs and the nursing notes.  Pertinent labs & imaging results that were available during my care of the patient were reviewed by me and considered in my medical decision making (see chart for details).    Pt is breathing better, but still feels weak.  I will give her a refill of her albuterol meds and give her an inhaler/spacer to take home with her.  She is given !V rocephin and zithromax.    She requested that I check  her urine which is negative.  Pt is stable for d/c.  Pt knows to return if worse and to f/u with pcp.   Final Clinical Impressions(s) / ED Diagnoses   Final diagnoses:  Acute bronchitis, unspecified organism  Tobacco abuse  Chest wall pain    ED Discharge Orders         Ordered     HYDROcodone-acetaminophen (NORCO/VICODIN) 5-325 MG tablet  Every 4 hours PRN     12/27/17 2250    azithromycin (ZITHROMAX) 250 MG tablet     12/27/17 2250    predniSONE (STERAPRED UNI-PAK 21 TAB) 10 MG (21) TBPK tablet  Daily     12/27/17 2250    albuterol (PROVENTIL) (2.5 MG/3ML) 0.083% nebulizer solution  Every 6 hours PRN,   Status:  Discontinued     12/27/17 2259    albuterol (PROVENTIL) (2.5 MG/3ML) 0.083% nebulizer solution  Every 6 hours PRN     12/27/17 2331           Jacalyn Lefevre, MD 12/27/17 1610    Jacalyn Lefevre, MD 12/27/17 2341

## 2019-11-13 ENCOUNTER — Ambulatory Visit (INDEPENDENT_AMBULATORY_CARE_PROVIDER_SITE_OTHER): Payer: Medicaid Other

## 2019-11-13 ENCOUNTER — Encounter: Payer: Self-pay | Admitting: Emergency Medicine

## 2019-11-13 ENCOUNTER — Other Ambulatory Visit: Payer: Self-pay

## 2019-11-13 ENCOUNTER — Ambulatory Visit
Admission: EM | Admit: 2019-11-13 | Discharge: 2019-11-13 | Disposition: A | Discharge status: Home / Self Care | Payer: Medicaid Other | Attending: Emergency Medicine | Admitting: Emergency Medicine

## 2019-11-13 DIAGNOSIS — R5383 Other fatigue: Secondary | ICD-10-CM | POA: Diagnosis not present

## 2019-11-13 DIAGNOSIS — J069 Acute upper respiratory infection, unspecified: Secondary | ICD-10-CM

## 2019-11-13 DIAGNOSIS — R05 Cough: Secondary | ICD-10-CM

## 2019-11-13 DIAGNOSIS — R531 Weakness: Secondary | ICD-10-CM

## 2019-11-13 MED ORDER — PREDNISONE 50 MG PO TABS: 50.0000 mg | ORAL_TABLET | Freq: Every day | ORAL | 0 refills | Status: DC

## 2019-11-13 MED ORDER — ALBUTEROL SULFATE HFA 108 (90 BASE) MCG/ACT IN AERS: 2.0000 | INHALATION_SPRAY | RESPIRATORY_TRACT | 0 refills | Status: DC | PRN

## 2019-11-13 MED ORDER — CETIRIZINE HCL 10 MG PO TABS: 10.0000 mg | ORAL_TABLET | Freq: Every day | ORAL | 0 refills | Status: DC

## 2019-11-13 MED ORDER — FLUTICASONE PROPIONATE 50 MCG/ACT NA SUSP: 1.0000 | Freq: Every day | NASAL | 0 refills | Status: AC

## 2019-11-13 MED ORDER — BENZONATATE 100 MG PO CAPS: 100.0000 mg | ORAL_CAPSULE | Freq: Three times a day (TID) | ORAL | 0 refills | Status: DC

## 2019-11-13 MED ORDER — ALBUTEROL SULFATE (2.5 MG/3ML) 0.083% IN NEBU: 2.5000 mg | INHALATION_SOLUTION | Freq: Four times a day (QID) | RESPIRATORY_TRACT | 0 refills | Status: DC | PRN

## 2019-11-13 NOTE — ED Triage Notes (Signed)
Pt here for cough and congestion x 1 week; pt sts increased asthma sx and sts out of home meds

## 2019-11-13 NOTE — Discharge Instructions (Signed)

## 2019-11-13 NOTE — ED Provider Notes (Signed)
[name]    CSN: [id number] Arrival date & time: 11/13/19  1123      History   Chief Complaint Chief Complaint  Patient presents with  . Cough    HPI Nicole Cohen is a 49 y.o. female with history of asthma, COPD presenting for weeklong course of URI symptoms.  Patient provides history: Endorsing mildly productive cough with nasal congestion.  Has had increased dyspnea and is out of her home inhalers.  No fever, chest pain, lower leg swelling, vomiting.  No known sick contacts, did not receive Covid vaccine.    Past Medical History:  Diagnosis Date  . Asthma   . COPD (chronic obstructive pulmonary disease) [facility])     Patient Active Problem List   Diagnosis Date Noted  . Severe right groin pain:  Concern is for pseudoaneurysm after left heart catheterization. 08/30/2012  . Chest pain with moderate risk for cardiac etiology 08/24/2012  . HLD (hyperlipidemia) 08/24/2012  . COPD (chronic obstructive pulmonary disease) (HCC) 08/24/2012  . Tobacco abuse 08/24/2012    Past Surgical History:  Procedure Laterality Date  . LEFT HEART CATHETERIZATION WITH CORONARY ANGIOGRAM N/A 08/24/2012   Procedure: LEFT HEART CATHETERIZATION WITH CORONARY ANGIOGRAM;  Surgeon: Runell Gess, MD;  Location: [facility] CATH LAB;  Service: Cardiovascular;  Laterality: N/A;    OB History   No obstetric history on file.      Home Medications    Prior to Admission medications   Medication Sig Start Date End Date Taking? Authorizing Provider  albuterol (PROVENTIL) (2.5 MG/3ML) 0.083% nebulizer solution Take 3 mLs (2.5 mg total) by nebulization every 6 (six) hours as needed for wheezing. 11/13/19   Hall-Potvin, [place], PA-C  albuterol (VENTOLIN HFA) 108 (90 Base) MCG/ACT inhaler Inhale 2 puffs into the lungs every 4 (four) hours as needed for wheezing or shortness of breath. 11/13/19   Hall-Potvin, [place], PA-C  arformoterol (BROVANA) 15 MCG/2ML NEBU Take 15 mcg by nebulization 2  (two) times daily as needed (SOB).    [provider]  benzonatate (TESSALON) 100 MG capsule Take 1 capsule (100 mg total) by mouth every 8 (eight) hours. 11/13/19   Hall-Potvin, [place], PA-C  cetirizine (ZYRTEC ALLERGY) 10 MG tablet Take 1 tablet (10 mg total) by mouth daily. 11/13/19   Hall-Potvin, [place], PA-C  fluticasone (FLONASE) 50 MCG/ACT nasal spray Place 1 spray into both nostrils daily. 11/13/19   Hall-Potvin, [place], PA-C  naproxen sodium (ALEVE) 220 MG tablet Take 440 mg by mouth 2 (two) times daily as needed (pain).    [provider]  predniSONE (DELTASONE) 50 MG tablet Take 1 tablet (50 mg total) by mouth daily with breakfast. 11/13/19   Hall-Potvin, [place], PA-C  Probiotic Product (PROBIOTIC PO) Take 1 tablet by mouth daily.    [provider]    Family History History reviewed. No pertinent family history.  Social History Social History   Tobacco Use  . Smoking status: Heavy Tobacco Smoker    Packs/day: 1.00    Years: 25.00    Pack years: 25.00    Types: Cigarettes  . Smokeless tobacco: Never Used  Substance Use Topics  . Alcohol use: No    Comment: Past history of heavy alcohol abuse. Sober x 5 years  . Drug use: Not Currently     Allergies   Aspirin   Review of Systems As per HPI   Physical Exam Triage Vital Signs ED Triage Vitals  Enc Vitals Group     BP  Pulse      Resp      Temp      Temp src      SpO2      Weight      Height      Head Circumference      Peak Flow      Pain Score      Pain Loc      Pain Edu?      Excl. in GC?    No data found.  Updated Vital Signs BP 119/79 (BP Location: Left Arm)   Pulse 89   Temp 98 F (36.7 C) (Oral)   Resp 16   SpO2 97%   Visual Acuity Right Eye Distance:   Left Eye Distance:   Bilateral Distance:    Right Eye Near:   Left Eye Near:    Bilateral Near:     Physical Exam Constitutional:      General: She is not in acute distress.    Appearance:  She is not ill-appearing or diaphoretic.  HENT:     Head: Normocephalic and atraumatic.     Mouth/Throat:     Mouth: Mucous membranes are moist.     Pharynx: Oropharynx is clear. No oropharyngeal exudate or posterior oropharyngeal erythema.  Eyes:     General: No scleral icterus.    Conjunctiva/sclera: Conjunctivae normal.     Pupils: Pupils are equal, round, and reactive to light.  Neck:     Comments: Trachea midline, negative JVD Cardiovascular:     Rate and Rhythm: Normal rate and regular rhythm.     Heart sounds: No murmur heard.  No gallop.   Pulmonary:     Effort: Pulmonary effort is normal. No respiratory distress.     Breath sounds: Wheezing and rhonchi present. No rales.  Musculoskeletal:     Cervical back: Neck supple. No tenderness.  Lymphadenopathy:     Cervical: No cervical adenopathy.  Skin:    Capillary Refill: Capillary refill takes less than 2 seconds.     Coloration: Skin is not jaundiced or pale.     Findings: No rash.  Neurological:     General: No focal deficit present.     Mental Status: She is alert and oriented to person, place, and time.      UC Treatments / Results  Labs (all labs ordered are listed, but only abnormal results are displayed) Labs Reviewed  NOVEL CORONAVIRUS, NAA    EKG   Radiology DG Chest 2 View  Result Date: 11/13/2019 CLINICAL DATA:  Productive cough.  Fatigue, weakness EXAM: CHEST - 2 VIEW COMPARISON:  12/27/2017 FINDINGS: Lungs are hyperinflated. No focal consolidation. No pleural effusion or pneumothorax. Heart and mediastinal contours are unremarkable. No acute osseous abnormality. IMPRESSION: No active cardiopulmonary disease. Electronically Signed   By: Elige Ko   On: 11/13/2019 12:57    Procedures Procedures (including critical care time)  Medications Ordered in UC Medications - No data to display  Initial Impression / Assessment and Plan / UC Course  I have reviewed the triage vital signs and the nursing  notes.  Pertinent labs & imaging results that were available during my care of the patient were reviewed by me and considered in my medical decision making (see chart for details).     Patient afebrile, nontoxic, with SpO2 97%.  Chest x-ray negative.  Covid PCR pending.  Patient to quarantine until results are back.  We will treat supportively as outlined below.  Return precautions  discussed, patient verbalized understanding and is agreeable to plan. Final Clinical Impressions(s) / UC Diagnoses   Final diagnoses:  URI with cough and congestion     Discharge Instructions     Tessalon for cough. Start flonase, atrovent nasal spray for nasal congestion/drainage. You can use over the counter nasal saline rinse such as neti pot for nasal congestion. Keep hydrated, your urine should be clear to pale yellow in color. Tylenol/motrin for fever and pain. Monitor for any worsening of symptoms, chest pain, shortness of breath, wheezing, swelling of the throat, go to the emergency department for further evaluation needed.     ED Prescriptions    Medication Sig Dispense Auth. Provider   albuterol (PROVENTIL) (2.5 MG/3ML) 0.083% nebulizer solution Take 3 mLs (2.5 mg total) by nebulization every 6 (six) hours as needed for wheezing. 75 mL Hall-Potvin, [place], PA-C   predniSONE (DELTASONE) 50 MG tablet Take 1 tablet (50 mg total) by mouth daily with breakfast. 5 tablet Hall-Potvin, [place], PA-C   albuterol (VENTOLIN HFA) 108 (90 Base) MCG/ACT inhaler Inhale 2 puffs into the lungs every 4 (four) hours as needed for wheezing or shortness of breath. 18 g Hall-Potvin, [place], PA-C   cetirizine (ZYRTEC ALLERGY) 10 MG tablet Take 1 tablet (10 mg total) by mouth daily. 30 tablet Hall-Potvin, [place], PA-C   fluticasone (FLONASE) 50 MCG/ACT nasal spray Place 1 spray into both nostrils daily. 16 g Hall-Potvin, [place], PA-C   benzonatate (TESSALON) 100 MG capsule Take 1 capsule (100 mg total) by mouth every  8 (eight) hours. 21 capsule Hall-Potvin, [place], PA-C     PDMP not reviewed this encounter.   Hall-Potvin, [place], [place] 11/13/19 1303

## 2019-11-14 LAB — NOVEL CORONAVIRUS, NAA: SARS-CoV-2, NAA: NOT DETECTED

## 2019-11-14 LAB — SARS-COV-2, NAA 2 DAY TAT

## 2019-12-25 ENCOUNTER — Other Ambulatory Visit: Payer: Self-pay

## 2019-12-25 ENCOUNTER — Ambulatory Visit
Admission: EM | Admit: 2019-12-25 | Discharge: 2019-12-25 | Disposition: A | Discharge status: Home / Self Care | Payer: Medicaid Other | Attending: Emergency Medicine | Admitting: Emergency Medicine

## 2019-12-25 DIAGNOSIS — J069 Acute upper respiratory infection, unspecified: Secondary | ICD-10-CM | POA: Diagnosis not present

## 2019-12-25 LAB — POCT URINALYSIS DIP (MANUAL ENTRY)
Bilirubin, UA: NEGATIVE
Glucose, UA: NEGATIVE mg/dL
Ketones, POC UA: NEGATIVE mg/dL
Leukocytes, UA: NEGATIVE
Nitrite, UA: NEGATIVE
Protein Ur, POC: NEGATIVE mg/dL
Spec Grav, UA: 1.025 (ref 1.010–1.025)
Urobilinogen, UA: 0.2 E.U./dL
pH, UA: 5.5 (ref 5.0–8.0)

## 2019-12-25 MED ORDER — PREDNISONE 50 MG PO TABS: 50.0000 mg | ORAL_TABLET | Freq: Every day | ORAL | 0 refills | Status: DC

## 2019-12-25 MED ORDER — ALBUTEROL SULFATE (2.5 MG/3ML) 0.083% IN NEBU: 2.5000 mg | INHALATION_SOLUTION | Freq: Four times a day (QID) | RESPIRATORY_TRACT | 0 refills | Status: AC | PRN

## 2019-12-25 MED ORDER — ALBUTEROL SULFATE HFA 108 (90 BASE) MCG/ACT IN AERS: 2.0000 | INHALATION_SPRAY | Freq: Four times a day (QID) | RESPIRATORY_TRACT | 2 refills | Status: AC | PRN

## 2019-12-25 MED ORDER — CETIRIZINE HCL 10 MG PO TABS: 10.0000 mg | ORAL_TABLET | Freq: Every day | ORAL | 0 refills | Status: DC

## 2019-12-25 MED ORDER — AZITHROMYCIN 250 MG PO TABS: 250.0000 mg | ORAL_TABLET | Freq: Every day | ORAL | 0 refills | Status: DC

## 2019-12-25 MED ORDER — DOXYCYCLINE HYCLATE 100 MG PO CAPS: 100.0000 mg | ORAL_CAPSULE | Freq: Two times a day (BID) | ORAL | 0 refills | Status: AC

## 2019-12-25 NOTE — Discharge Instructions (Addendum)

## 2019-12-25 NOTE — ED Triage Notes (Signed)
Pt is here for cough and ear pain x one week and fever that started 2 days ago.  She also c/o low back and abdominal pain with urinary frequency that started 4 days ago.

## 2019-12-25 NOTE — ED Provider Notes (Signed)
[name]    CSN: [id number] Arrival date & time: 12/25/19  1132      History   Chief Complaint Chief Complaint  Patient presents with  . Cough  . Fever    100-101.  . Otalgia  . Dysuria    HPI Nicole Cohen is a 49 y.o. female  Presenting for recurrent cough, bilateral otalgia, fever.  States this has been ongoing since last visit.  Seen by me 8/15 for similar symptoms: Treat supportively.  Unknown T-max.  Denies posttussive emesis, shortness of breath or chest pain.  Cough is productive, without blood.  Patient also notes low back and abdominal pain with urinary frequency.  Denies pelvic or vaginal pain, discharge.  No hematuria, urgency.  States that she deals with this intermittently.  No change in diet, lifestyle, medications.  Past Medical History:  Diagnosis Date  . Asthma   . COPD (chronic obstructive pulmonary disease) [facility])     Patient Active Problem List   Diagnosis Date Noted  . Severe right groin pain:  Concern is for pseudoaneurysm after left heart catheterization. 08/30/2012  . Chest pain with moderate risk for cardiac etiology 08/24/2012  . HLD (hyperlipidemia) 08/24/2012  . COPD (chronic obstructive pulmonary disease) (HCC) 08/24/2012  . Tobacco abuse 08/24/2012    Past Surgical History:  Procedure Laterality Date  . LEFT HEART CATHETERIZATION WITH CORONARY ANGIOGRAM N/A 08/24/2012   Procedure: LEFT HEART CATHETERIZATION WITH CORONARY ANGIOGRAM;  Surgeon: Runell Gess, MD;  Location: [facility] CATH LAB;  Service: Cardiovascular;  Laterality: N/A;    OB History   No obstetric history on file.      Home Medications    Prior to Admission medications   Medication Sig Start Date End Date Taking? Authorizing Provider  albuterol (PROVENTIL) (2.5 MG/3ML) 0.083% nebulizer solution Take 3 mLs (2.5 mg total) by nebulization every 6 (six) hours as needed for wheezing. 12/25/19   Hall-Potvin, [place], PA-C  albuterol (VENTOLIN HFA) 108 (90  Base) MCG/ACT inhaler Inhale 2 puffs into the lungs every 6 (six) hours as needed for wheezing or shortness of breath. Please dispense PROAIR 12/25/19   Hall-Potvin, [place], PA-C  azithromycin (ZITHROMAX) 250 MG tablet Take 1 tablet (250 mg total) by mouth daily. Take first 2 tablets together, then 1 every day until finished. 12/25/19   Hall-Potvin, [place], PA-C  benzonatate (TESSALON) 100 MG capsule Take 1 capsule (100 mg total) by mouth every 8 (eight) hours. 11/13/19   Hall-Potvin, [place], PA-C  cetirizine (ZYRTEC ALLERGY) 10 MG tablet Take 1 tablet (10 mg total) by mouth daily. 12/25/19   Hall-Potvin, [place], PA-C  doxycycline (VIBRAMYCIN) 100 MG capsule Take 1 capsule (100 mg total) by mouth 2 (two) times daily for 7 days. 12/25/19 01/01/20  Hall-Potvin, [place], PA-C  fluticasone (FLONASE) 50 MCG/ACT nasal spray Place 1 spray into both nostrils daily. 11/13/19   Hall-Potvin, [place], PA-C  naproxen sodium (ALEVE) 220 MG tablet Take 440 mg by mouth 2 (two) times daily as needed (pain).    [provider]  predniSONE (DELTASONE) 50 MG tablet Take 1 tablet (50 mg total) by mouth daily with breakfast. 12/25/19   Hall-Potvin, [place], PA-C  Probiotic Product (PROBIOTIC PO) Take 1 tablet by mouth daily.    [provider]    Family History Family History  Problem Relation Age of Onset  . Heart disease Mother     Social History Social History   Tobacco Use  . Smoking status: Heavy Tobacco Smoker  Packs/day: 1.00    Years: 25.00    Pack years: 25.00    Types: Cigarettes  . Smokeless tobacco: Never Used  Substance Use Topics  . Alcohol use: No    Comment: Past history of heavy alcohol abuse. Sober x 5 years  . Drug use: Not Currently     Allergies   Aspirin   Review of Systems As per HPI   Physical Exam Triage Vital Signs ED Triage Vitals  Enc Vitals Group     BP 12/25/19 1159 115/82     Pulse Rate 12/25/19 1159 (!) 107     Resp 12/25/19 1159 16      Temp 12/25/19 1159 98.3 F (36.8 C)     Temp Source 12/25/19 1159 Oral     SpO2 12/25/19 1159 96 %     Weight --      Height --      Head Circumference --      Peak Flow --      Pain Score 12/25/19 1154 5     Pain Loc --      Pain Edu? --      Excl. in GC? --    No data found.  Updated Vital Signs BP 115/82 (BP Location: Left Arm)   Pulse (!) 107   Temp 98.3 F (36.8 C) (Oral)   Resp 16   SpO2 96%   Visual Acuity Right Eye Distance:   Left Eye Distance:   Bilateral Distance:    Right Eye Near:   Left Eye Near:    Bilateral Near:     Physical Exam Constitutional:      General: She is not in acute distress.    Appearance: She is obese. She is not ill-appearing or diaphoretic.  HENT:     Head: Normocephalic and atraumatic.     Mouth/Throat:     Mouth: Mucous membranes are moist.     Pharynx: Oropharynx is clear. No oropharyngeal exudate or posterior oropharyngeal erythema.  Eyes:     General: No scleral icterus.    Conjunctiva/sclera: Conjunctivae normal.     Pupils: Pupils are equal, round, and reactive to light.  Neck:     Comments: Trachea midline, negative JVD Cardiovascular:     Rate and Rhythm: Normal rate and regular rhythm.     Heart sounds: No murmur heard.  No gallop.   Pulmonary:     Effort: Pulmonary effort is normal. No respiratory distress.     Breath sounds: Wheezing and rhonchi present. No rales.     Comments: Moderate, diffuse Abdominal:     General: Bowel sounds are normal.     Palpations: Abdomen is soft.  Musculoskeletal:     Cervical back: Neck supple. No tenderness.  Lymphadenopathy:     Cervical: No cervical adenopathy.  Skin:    Capillary Refill: Capillary refill takes less than 2 seconds.     Coloration: Skin is not jaundiced or pale.     Findings: No rash.  Neurological:     General: No focal deficit present.     Mental Status: She is alert and oriented to person, place, and time.      UC Treatments / Results  Labs  (all labs ordered are listed, but only abnormal results are displayed) Labs Reviewed  POCT URINALYSIS DIP (MANUAL ENTRY) - Abnormal; Notable for the following components:      Result Value   Blood, UA trace-intact (*)    All other components within normal limits  NOVEL CORONAVIRUS, NAA    EKG   Radiology No results found.  Procedures Procedures (including critical care time)  Medications Ordered in UC Medications - No data to display  Initial Impression / Assessment and Plan / UC Course  I have reviewed the triage vital signs and the nursing notes.  Pertinent labs & imaging results that were available during my care of the patient were reviewed by me and considered in my medical decision making (see chart for details).     Patient afebrile, nontoxic, with SpO2 96%.  Urine with trace intact blood.  Covid PCR pending.  Patient to quarantine until results are back.  We will treat supportively as outlined below: Given history of COPD, recurrent URI symptoms, will cover empirically with azithromycin, doxycycline as we are unable to obtain CXR today.  Return precautions discussed, patient verbalized understanding and is agreeable to plan. Final Clinical Impressions(s) / UC Diagnoses   Final diagnoses:  URI with cough and congestion     Discharge Instructions     Tessalon for cough. Start flonase, atrovent nasal spray for nasal congestion/drainage. You can use over the counter nasal saline rinse such as neti pot for nasal congestion. Keep hydrated, your urine should be clear to pale yellow in color. Tylenol/motrin for fever and pain. Monitor for any worsening of symptoms, chest pain, shortness of breath, wheezing, swelling of the throat, go to the emergency department for further evaluation needed.     ED Prescriptions    Medication Sig Dispense Auth. Provider   albuterol (PROVENTIL) (2.5 MG/3ML) 0.083% nebulizer solution Take 3 mLs (2.5 mg total) by nebulization every 6 (six)  hours as needed for wheezing. 75 mL Hall-Potvin, [place], PA-C   cetirizine (ZYRTEC ALLERGY) 10 MG tablet Take 1 tablet (10 mg total) by mouth daily. 30 tablet Hall-Potvin, [place], PA-C   predniSONE (DELTASONE) 50 MG tablet Take 1 tablet (50 mg total) by mouth daily with breakfast. 5 tablet Hall-Potvin, [place], PA-C   albuterol (VENTOLIN HFA) 108 (90 Base) MCG/ACT inhaler Inhale 2 puffs into the lungs every 6 (six) hours as needed for wheezing or shortness of breath. Please dispense PROAIR 8 g Hall-Potvin, [place], PA-C   doxycycline (VIBRAMYCIN) 100 MG capsule Take 1 capsule (100 mg total) by mouth 2 (two) times daily for 7 days. 14 capsule Hall-Potvin, [place], PA-C   azithromycin (ZITHROMAX) 250 MG tablet Take 1 tablet (250 mg total) by mouth daily. Take first 2 tablets together, then 1 every day until finished. 6 tablet Hall-Potvin, [place], PA-C     PDMP not reviewed this encounter.   Hall-Potvin, [place], [place] 12/25/19 1338

## 2019-12-27 LAB — NOVEL CORONAVIRUS, NAA: SARS-CoV-2, NAA: NOT DETECTED

## 2019-12-27 LAB — SARS-COV-2, NAA 2 DAY TAT

## 2021-07-17 ENCOUNTER — Ambulatory Visit (INDEPENDENT_AMBULATORY_CARE_PROVIDER_SITE_OTHER): Payer: Medicaid Other

## 2021-07-17 ENCOUNTER — Ambulatory Visit
Admission: EM | Admit: 2021-07-17 | Discharge: 2021-07-17 | Disposition: A | Discharge status: Home / Self Care | Payer: Medicaid Other | Attending: Family Medicine | Admitting: Family Medicine

## 2021-07-17 DIAGNOSIS — S60221A Contusion of right hand, initial encounter: Secondary | ICD-10-CM

## 2021-07-17 DIAGNOSIS — M79641 Pain in right hand: Secondary | ICD-10-CM | POA: Diagnosis not present

## 2021-07-17 DIAGNOSIS — J441 Chronic obstructive pulmonary disease with (acute) exacerbation: Secondary | ICD-10-CM | POA: Diagnosis not present

## 2021-07-17 MED ORDER — PREDNISONE 20 MG PO TABS: 40.0000 mg | ORAL_TABLET | Freq: Every day | ORAL | 0 refills | Status: DC

## 2021-07-17 NOTE — Discharge Instructions (Addendum)
Apply a compressive ACE bandage. Rest and elevate the affected painful area.  Apply cold compresses intermittently as needed.  As pain recedes, begin normal activities slowly as tolerated.  Call orthopedics if symptoms worsens or doesn't improve. ?Start prednisone 40 mg once daily with food for 5 days.  I am treating your hand but this will also help with your increased work of breathing and wheezing.  If your symptoms do not improve relating to your hand please follow-up with [facility].  Your hand imaging today was negative for fracture. ?

## 2021-07-17 NOTE — ED Provider Notes (Signed)
?[name] ? ? ? ?CSN: [id number] ?Arrival date & time: 07/17/21  1649 ? ? ?  ? ?History   ?Chief Complaint ?Chief Complaint  ?Patient presents with  ?? Hand Injury  ? ? ?HPI ?Nicole Cohen is a 51 y.o. female.  ? ?HPI ?Patient with a history of COPD presents today for evaluation of a hand injury.  She reports her dog took off while she was attempting to walk the dog and her hand hit a metal pole or possibly was even caught between the door.  She reports pain with movement of her fingers.  This occurred earlier in the morning.  Attempted to apply ice to her hand but reports that made the pain worse. ?Of note patient is having some audible wheezing.  She reports that she has severe COPD which requires oxygen at home as needed.  She reports over the last few days she has been wheezing a little bit more frequently.  Her oxygen level has fluctuated upon her arrival here at the urgent care between 91 and 93%.  She reports she also uses nebulizer treatments and inhalers daily. ?Past Medical History:  ?Diagnosis Date  ?? Asthma   ?? COPD (chronic obstructive pulmonary disease) (HCC)   ? ? ?Patient Active Problem List  ? Diagnosis Date Noted  ?? Severe right groin pain:  Concern is for pseudoaneurysm after left heart catheterization. 08/30/2012  ?? Chest pain with moderate risk for cardiac etiology 08/24/2012  ?? HLD (hyperlipidemia) 08/24/2012  ?? COPD (chronic obstructive pulmonary disease) (HCC) 08/24/2012  ?? Tobacco abuse 08/24/2012  ? ? ?Past Surgical History:  ?Procedure Laterality Date  ?? LEFT HEART CATHETERIZATION WITH CORONARY ANGIOGRAM N/A 08/24/2012  ? Procedure: LEFT HEART CATHETERIZATION WITH CORONARY ANGIOGRAM;  Surgeon: Runell Gess, MD;  Location: [facility] CATH LAB;  Service: Cardiovascular;  Laterality: N/A;  ? ? ?OB History   ?No obstetric history on file. ?  ? ? ? ?Home Medications   ? ?Prior to Admission medications   ?Medication Sig Start Date End Date Taking? Authorizing Provider   ?predniSONE (DELTASONE) 20 MG tablet Take 2 tablets (40 mg total) by mouth daily with breakfast. 07/17/21  Yes Bing Neighbors, FNP  ?albuterol (PROVENTIL) (2.5 MG/3ML) 0.083% nebulizer solution Take 3 mLs (2.5 mg total) by nebulization every 6 (six) hours as needed for wheezing. 12/25/19   Hall-Potvin, [place], PA-C  ?albuterol (VENTOLIN HFA) 108 (90 Base) MCG/ACT inhaler Inhale 2 puffs into the lungs every 6 (six) hours as needed for wheezing or shortness of breath. Please dispense PROAIR 12/25/19   Hall-Potvin, [place], PA-C  ?azithromycin (ZITHROMAX) 250 MG tablet Take 1 tablet (250 mg total) by mouth daily. Take first 2 tablets together, then 1 every day until finished. 12/25/19   Hall-Potvin, [place], PA-C  ?benzonatate (TESSALON) 100 MG capsule Take 1 capsule (100 mg total) by mouth every 8 (eight) hours. 11/13/19   Hall-Potvin, [place], PA-C  ?cetirizine (ZYRTEC ALLERGY) 10 MG tablet Take 1 tablet (10 mg total) by mouth daily. 12/25/19   Hall-Potvin, [place], PA-C  ?fluticasone (FLONASE) 50 MCG/ACT nasal spray Place 1 spray into both nostrils daily. 11/13/19   Hall-Potvin, [place], PA-C  ?naproxen sodium (ALEVE) 220 MG tablet Take 440 mg by mouth 2 (two) times daily as needed (pain).    [provider]  ?Probiotic Product (PROBIOTIC PO) Take 1 tablet by mouth daily.    [provider]  ? ? ?Family History ?Family History  ?Problem Relation Age of Onset  ??  Heart disease Mother   ? ? ?Social History ?Social History  ? ?Tobacco Use  ?? Smoking status: Heavy Smoker  ?  Packs/day: 1.00  ?  Years: 25.00  ?  Pack years: 25.00  ?  Types: Cigarettes  ?? Smokeless tobacco: Never  ?Substance Use Topics  ?? Alcohol use: No  ?  Comment: Past history of heavy alcohol abuse. Sober x 5 years  ?? Drug use: Not Currently  ? ? ? ?Allergies   ?Aspirin ? ? ?Review of Systems ?Review of Systems ?Pertinent negatives listed in HPI  ? ?Physical Exam ?Triage Vital Signs ?ED Triage Vitals  ?Enc Vitals Group  ?    BP 07/17/21 1659 98/62  ?   Pulse Rate 07/17/21 1659 82  ?   Resp 07/17/21 1659 20  ?   Temp 07/17/21 1659 97.7 ?F (36.5 ?C)  ?   Temp Source 07/17/21 1659 Oral  ?   SpO2 07/17/21 1659 93 %  ?   Weight --   ?   Height --   ?   Head Circumference --   ?   Peak Flow --   ?   Pain Score 07/17/21 1710 5  ?   Pain Loc --   ?   Pain Edu? --   ?   Excl. in GC? --   ? ?No data found. ? ?Updated Vital Signs ?BP 98/62 (BP Location: Left Arm)   Pulse 82   Temp 97.7 ?F (36.5 ?C) (Oral)   Resp 20   SpO2 93%  ? ?Visual Acuity ?Right Eye Distance:   ?Left Eye Distance:   ?Bilateral Distance:   ? ?Right Eye Near:   ?Left Eye Near:    ?Bilateral Near:    ? ?Physical Exam ?HENT:  ?   Head: Normocephalic and atraumatic.  ?Eyes:  ?   Extraocular Movements: Extraocular movements intact.  ?   Pupils: Pupils are equal, round, and reactive to light.  ?Cardiovascular:  ?   Rate and Rhythm: Normal rate and regular rhythm.  ?Pulmonary:  ?   Breath sounds: Wheezing and rhonchi present. No rales.  ?Musculoskeletal:  ?   Right hand: Tenderness present. Decreased range of motion.  ?     Arms: ? ?   Cervical back: Normal range of motion and neck supple.  ?Skin: ?   General: Skin is warm and dry.  ?   Capillary Refill: Capillary refill takes less than 2 seconds.  ?Neurological:  ?   General: No focal deficit present.  ?   Mental Status: She is alert.  ?Psychiatric:     ?   Mood and Affect: Mood normal.     ?   Behavior: Behavior normal.     ?   Thought Content: Thought content normal.     ?   Judgment: Judgment normal.  ? ? ? ?UC Treatments / Results  ?Labs ?(all labs ordered are listed, but only abnormal results are displayed) ?Labs Reviewed - No data to display ? ?EKG ? ? ?Radiology ?DG Hand Complete Right ? ?Result Date: 07/17/2021 ?CLINICAL DATA:  Status post trauma. EXAM: RIGHT HAND - COMPLETE 3+ VIEW COMPARISON:  None. FINDINGS: There is no evidence of fracture or dislocation. There is no evidence of arthropathy or other focal bone  abnormality. Soft tissues are unremarkable. IMPRESSION: Negative. Electronically Signed   By: Aram Candelahaddeus  Houston M.D.   On: 07/17/2021 17:30   ? ?Procedures ?Procedures (including critical care time) ? ?Medications Ordered in UC ?  Medications - No data to display ? ?Initial Impression / Assessment and Plan / UC Course  ?I have reviewed the triage vital signs and the nursing notes. ? ?Pertinent labs & imaging results that were available during my care of the patient were reviewed by me and considered in my medical decision making (see chart for details). ? ?  ?Right hand injury resulting in a contusion of the right hand.  Imaging negative for an acute fracture.  Conservative treatment with Ace wrap, elevate and cold compresses as needed.  Given patient is having active acute COPD of exacerbation prescribed prednisone 40 mg once daily for 5 days this will also help with hand swelling and pain.  Patient advised to follow-up with [facility] if her hand pain does not readily resolve with treatment.  Resume home oxygen and maintenance treatment of COPD while taking prednisone.  ER if symptoms worsen. ?Final Clinical Impressions(s) / UC Diagnoses  ? ?Final diagnoses:  ?COPD with acute exacerbation (HCC)  ?Contusion of right hand, initial encounter  ? ? ? ?Discharge Instructions   ? ?  ?Apply a compressive ACE bandage. Rest and elevate the affected painful area.  Apply cold compresses intermittently as needed.  As pain recedes, begin normal activities slowly as tolerated.  Call orthopedics if symptoms worsens or doesn't improve. ?Start prednisone 40 mg once daily with food for 5 days.  I am treating your hand but this will also help with your increased work of breathing and wheezing.  If your symptoms do not improve relating to your hand please follow-up with [facility].  Your hand imaging today was negative for fracture. ? ? ?ED Prescriptions   ? ? Medication Sig Dispense Auth. Provider  ? predniSONE (DELTASONE) 20 MG  tablet Take 2 tablets (40 mg total) by mouth daily with breakfast. 10 tablet Bing Neighbors, FNP  ? ?  ? ?PDMP not reviewed this encounter. ?  ?Bing Neighbors, FNP ?07/17/21 1800 ? ?

## 2021-07-17 NOTE — ED Triage Notes (Signed)
Pt presents with right hand pain. States she injured her hand today and slammed it onto a door.  ?

## 2021-11-06 ENCOUNTER — Encounter: Payer: Self-pay | Admitting: Psychology

## 2022-02-03 ENCOUNTER — Other Ambulatory Visit (HOSPITAL_COMMUNITY): Payer: Self-pay | Admitting: Gastroenterology

## 2022-02-03 DIAGNOSIS — K311 Adult hypertrophic pyloric stenosis: Secondary | ICD-10-CM

## 2022-02-05 ENCOUNTER — Ambulatory Visit (HOSPITAL_COMMUNITY): Admission: RE | Admit: 2022-02-05 | Payer: Medicaid Other | Source: Ambulatory Visit

## 2022-02-05 ENCOUNTER — Encounter (HOSPITAL_COMMUNITY): Payer: Self-pay

## 2022-02-17 ENCOUNTER — Ambulatory Visit (HOSPITAL_COMMUNITY)
Admission: RE | Admit: 2022-02-17 | Discharge: 2022-02-17 | Disposition: A | Discharge status: Home / Self Care | Payer: Medicaid Other | Source: Ambulatory Visit | Attending: Gastroenterology | Admitting: Gastroenterology

## 2022-02-17 DIAGNOSIS — K311 Adult hypertrophic pyloric stenosis: Secondary | ICD-10-CM | POA: Insufficient documentation

## 2022-04-29 ENCOUNTER — Ambulatory Visit
Admission: EM | Admit: 2022-04-29 | Discharge: 2022-04-29 | Disposition: A | Discharge status: Home / Self Care | Payer: Medicaid Other

## 2022-04-29 ENCOUNTER — Other Ambulatory Visit: Payer: Self-pay

## 2022-04-29 ENCOUNTER — Encounter: Payer: Self-pay | Admitting: *Deleted

## 2022-04-29 DIAGNOSIS — J441 Chronic obstructive pulmonary disease with (acute) exacerbation: Secondary | ICD-10-CM | POA: Diagnosis not present

## 2022-04-29 NOTE — Discharge Instructions (Signed)
  Please report to ED immediately after leaving our office.

## 2022-04-29 NOTE — ED Triage Notes (Addendum)
PT reports she is O2 dependent on 2 liters of nasal o2 . Pt has had to increase O2 to 3 liters due to [facility] and increase breathing treatments. Pt . Pt has not called to reports changes to lung MD.

## 2022-04-29 NOTE — ED Provider Notes (Signed)
[name]    CSN: [id number] Arrival date & time: 04/29/22  1738      History   Chief Complaint Chief Complaint  Patient presents with   Shortness of Breath    HPI Nicole Cohen is a 52 y.o. female.   Patient presents today with her husband for evaluation of shortness of breath, increased cough, etc that has seemingly worsened over the last several days. She has been requiring 3 L of oxygen via  vs the 2L she had previously been stable with. She has also been requiring more frequent neb treatments. She has increased work of breathing.   The history is provided by the patient.  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 08/30/2012   Chest pain with moderate risk for cardiac etiology 08/24/2012   HLD (hyperlipidemia) 08/24/2012   COPD (chronic obstructive pulmonary disease) ([place]) 08/24/2012   Tobacco abuse 08/24/2012    Past Surgical History:  Procedure Laterality Date   LEFT HEART CATHETERIZATION WITH CORONARY ANGIOGRAM N/A 08/24/2012   Procedure: LEFT HEART CATHETERIZATION WITH CORONARY ANGIOGRAM;  Surgeon: Lorretta Harp, MD;  Location: [facility] CATH LAB;  Service: Cardiovascular;  Laterality: N/A;    OB History   No obstetric history on file.      Home Medications    Prior to Admission medications   Medication Sig Start Date End Date Taking? Authorizing Provider  albuterol (PROVENTIL) (2.5 MG/3ML) 0.083% nebulizer solution Take 3 mLs (2.5 mg total) by nebulization every 6 (six) hours as needed for wheezing. 12/25/19   Hall-Potvin, [place], PA-C  albuterol (VENTOLIN HFA) 108 (90 Base) MCG/ACT inhaler Inhale 2 puffs into the lungs every 6 (six)  hours as needed for wheezing or shortness of breath. Please dispense PROAIR 12/25/19   Hall-Potvin, [place], PA-C  azithromycin (ZITHROMAX) 250 MG tablet Take 1 tablet (250 mg total) by mouth daily. Take first 2 tablets together, then 1 every day until finished. 12/25/19   Hall-Potvin, [place], PA-C  benzonatate (TESSALON) 100 MG capsule Take 1 capsule (100 mg total) by mouth every 8 (eight) hours. 11/13/19   Hall-Potvin, [place], PA-C  cetirizine (ZYRTEC ALLERGY) 10 MG tablet Take 1 tablet (10 mg total) by mouth daily. 12/25/19   Hall-Potvin, [place], PA-C  fluticasone (FLONASE) 50 MCG/ACT nasal spray Place 1 spray into both nostrils daily. 11/13/19   Hall-Potvin, [place], PA-C  naproxen sodium (ALEVE) 220 MG tablet Take 440 mg by mouth 2 (two) times daily as needed (pain).    [provider]  predniSONE (DELTASONE) 20 MG tablet Take 2 tablets (40 mg total) by mouth daily with breakfast. 07/17/21   Scot Jun, NP  Probiotic Product (PROBIOTIC PO) Take 1 tablet by mouth daily.    [provider]    Family History Family History  Problem Relation Age of Onset   Heart disease Mother     Social History Social History   Tobacco Use   Smoking status: Heavy Smoker    Packs/day: 1.00    Years: 25.00    Total pack years: 25.00    Types: Cigarettes   Smokeless tobacco: Never  Substance Use Topics   Alcohol use: No  Comment: Past history of heavy alcohol abuse. Sober x 5 years   Drug use: Not Currently     Allergies   Aspirin   Review of Systems Review of Systems  Constitutional:  Negative for chills and fever.  Eyes:  Negative for discharge and redness.  Respiratory:  Positive for cough and shortness of breath.   Gastrointestinal:  Negative for vomiting.     Physical Exam Triage Vital Signs ED Triage Vitals  Enc Vitals Group     BP 04/29/22 1844 120/77     Pulse Rate 04/29/22 1844 (!) 121     Resp 04/29/22 1844 20     Temp 04/29/22 1844 97.9 F  (36.6 C)     Temp Source 04/29/22 1844 Oral     SpO2 04/29/22 1844 91 %     Weight --      Height --      Head Circumference --      Peak Flow --      Pain Score 04/29/22 1855 4     Pain Loc --      Pain Edu? --      Excl. in [place]? --    No data found.  Updated Vital Signs BP 120/77 (BP Location: Left Arm)   Pulse (!) 111   Temp 97.9 F (36.6 C) (Oral)   Resp 20   SpO2 93%      Physical Exam Vitals and nursing note reviewed.  Constitutional:      General: She is in acute distress.     Comments: Patient appears to have mild difficulty breathing despite oxygen supplementation  HENT:     Head: Normocephalic and atraumatic.  Eyes:     Conjunctiva/sclera: Conjunctivae normal.  Cardiovascular:     Rate and Rhythm: Tachycardia present.  Pulmonary:     Effort: Respiratory distress present.     Comments: Patient with increased work of breathing- retractions to supraclavicular area noted Neurological:     Mental Status: She is alert.  Psychiatric:        Mood and Affect: Mood normal.        Behavior: Behavior normal.      UC Treatments / Results  Labs (all labs ordered are listed, but only abnormal results are displayed) Labs Reviewed - No data to display  EKG   Radiology No results found.  Procedures Procedures (including critical care time)  Medications Ordered in UC Medications - No data to display  Initial Impression / Assessment and Plan / UC Course  I have reviewed the triage vital signs and the nursing notes.  Pertinent labs & imaging results that were available during my care of the patient were reviewed by me and considered in my medical decision making (see chart for details).    Recommended further evaluation in the ED as there is high probability patient will need admission for treatment and monitoring. Patient and husband are somewhat resistant but agree without further questions.   Final Clinical Impressions(s) / UC Diagnoses   Final  diagnoses:  COPD exacerbation [facility])     Discharge Instructions       Please report to ED immediately after leaving our office.    ED Prescriptions   None    PDMP not reviewed this encounter.   Francene Finders, PA-C 04/29/22 1909

## 2022-05-28 ENCOUNTER — Encounter: Payer: Medicaid Other | Attending: Psychology | Admitting: Psychology

## 2022-11-14 IMAGING — DX DG HAND COMPLETE 3+V*R*
3 series · 3 of 3 positions shown · non-contrast
Comparison: None.

CLINICAL DATA: Status post trauma.

EXAM:
RIGHT HAND - COMPLETE 3+ VIEW

[hand pa]
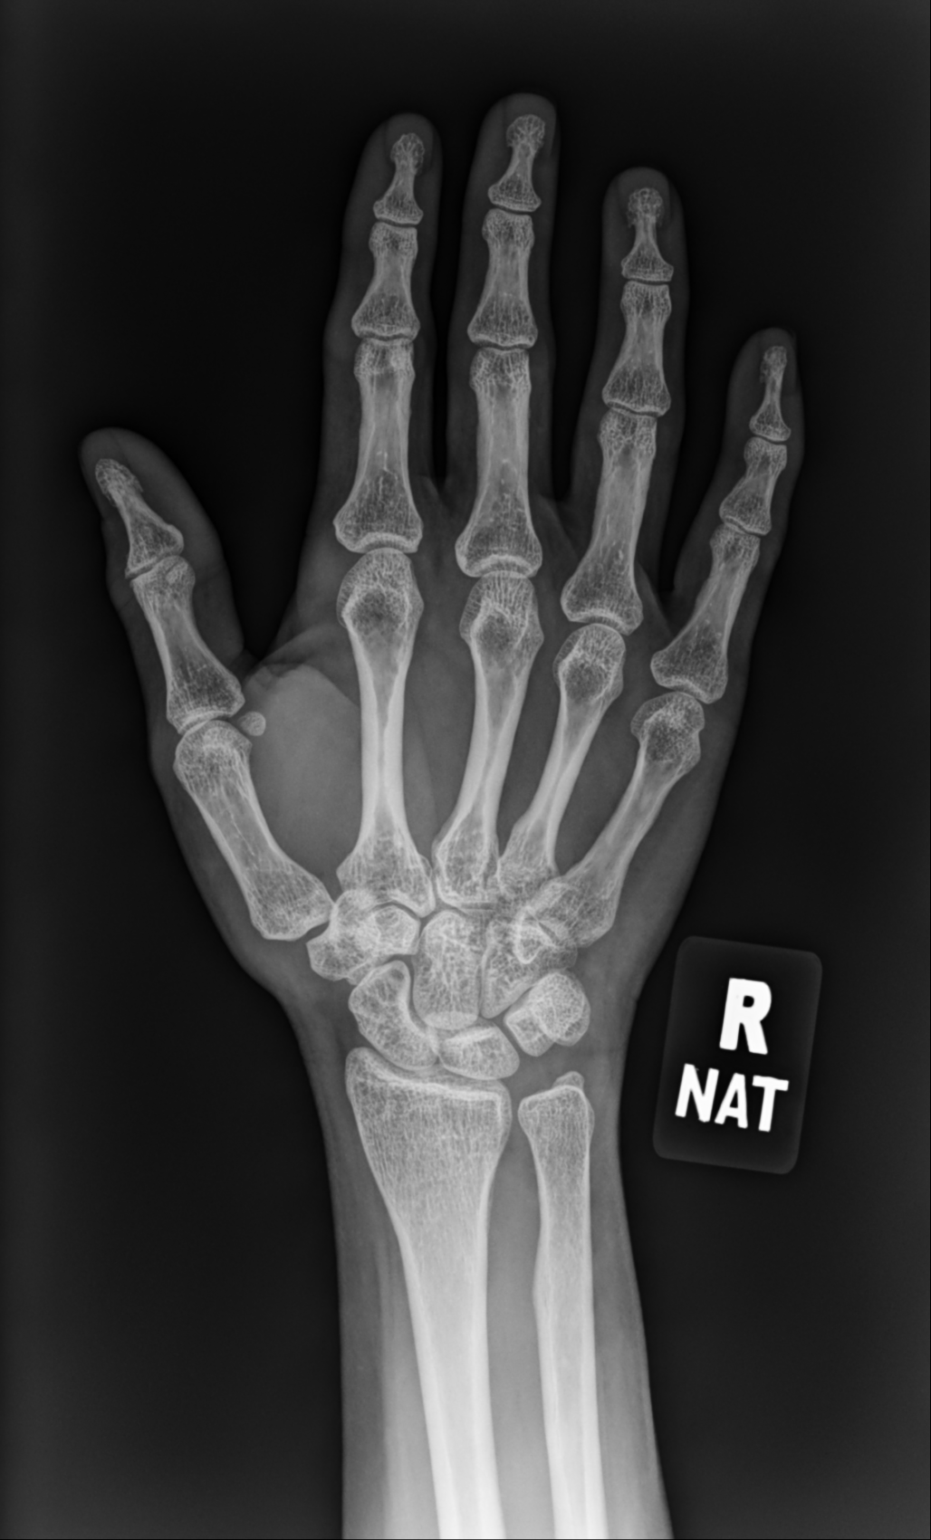

[hand mlo]
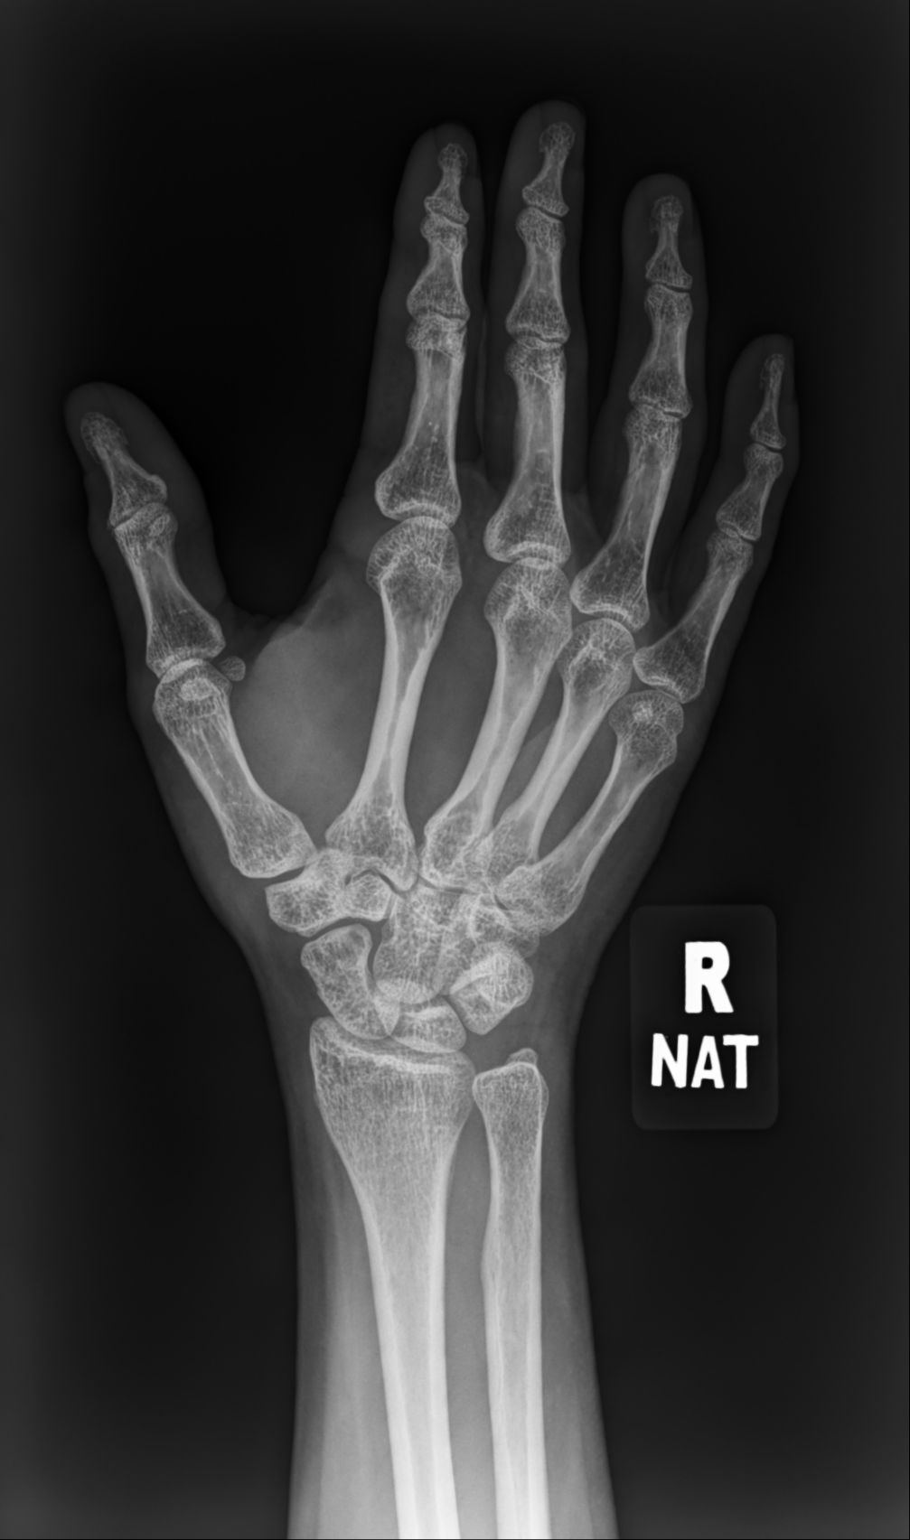

[hand lat]
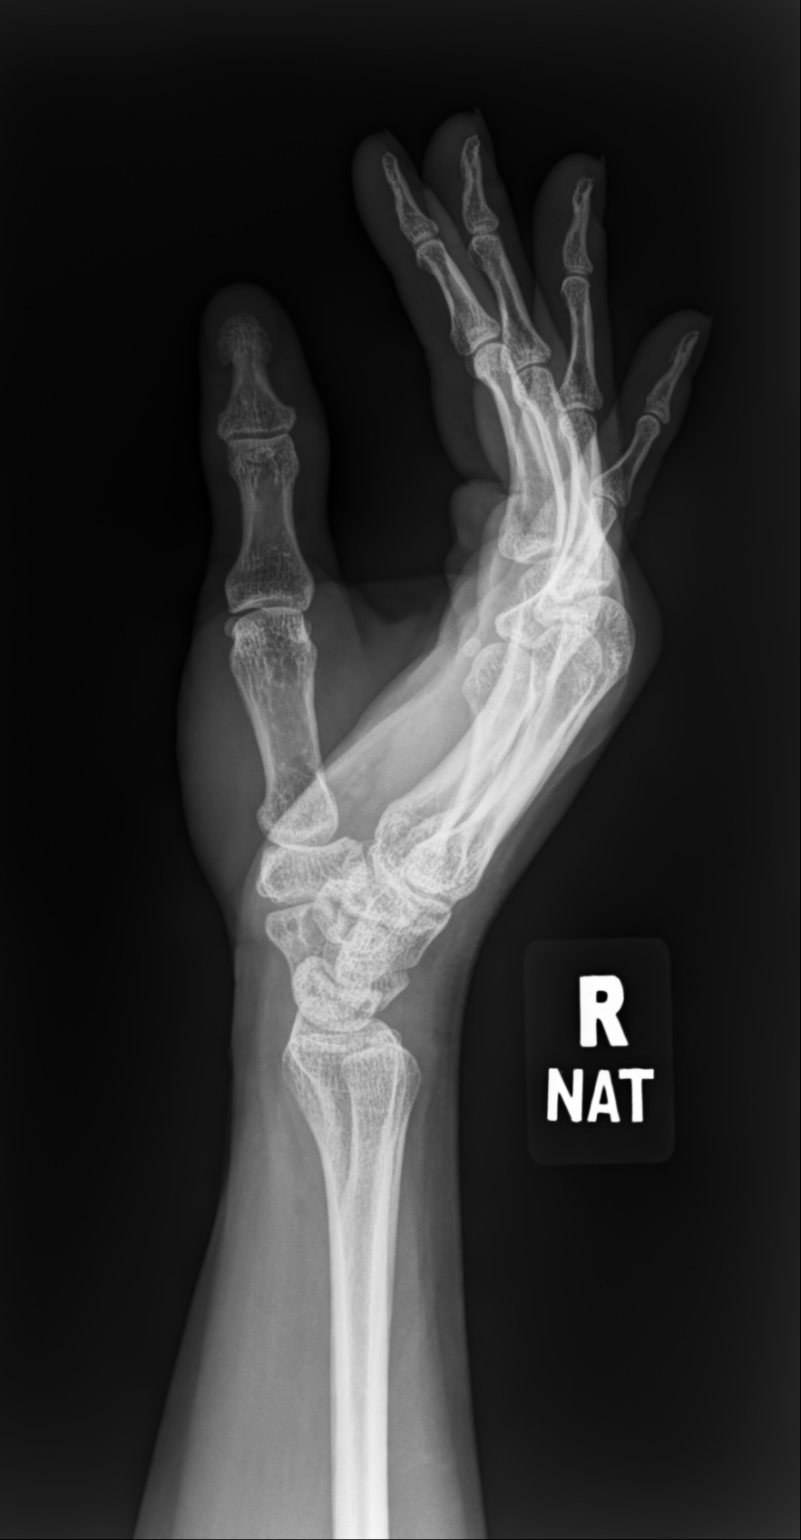

[3 of 3 positions shown; findings below may reference images not displayed]

FINDINGS: There is no evidence of fracture or dislocation. There is no
evidence of arthropathy or other focal bone abnormality. Soft
tissues are unremarkable.
IMPRESSION: Negative.

## 2023-01-13 ENCOUNTER — Emergency Department (HOSPITAL_COMMUNITY): Payer: Medicaid Other

## 2023-01-13 ENCOUNTER — Inpatient Hospital Stay (HOSPITAL_COMMUNITY)
Admission: EM | Admit: 2023-01-13 | Discharge: 2023-01-21 | DRG: 190 | Disposition: A | Discharge status: Home / Self Care | Payer: Medicaid Other | Attending: Family Medicine | Admitting: Family Medicine

## 2023-01-13 ENCOUNTER — Other Ambulatory Visit: Payer: Self-pay

## 2023-01-13 ENCOUNTER — Encounter (HOSPITAL_COMMUNITY): Payer: Self-pay

## 2023-01-13 DIAGNOSIS — Z7952 Long term (current) use of systemic steroids: Secondary | ICD-10-CM | POA: Diagnosis not present

## 2023-01-13 DIAGNOSIS — J45901 Unspecified asthma with (acute) exacerbation: Secondary | ICD-10-CM | POA: Diagnosis present

## 2023-01-13 DIAGNOSIS — Z7951 Long term (current) use of inhaled steroids: Secondary | ICD-10-CM | POA: Diagnosis not present

## 2023-01-13 DIAGNOSIS — E782 Mixed hyperlipidemia: Secondary | ICD-10-CM | POA: Diagnosis present

## 2023-01-13 DIAGNOSIS — Z8249 Family history of ischemic heart disease and other diseases of the circulatory system: Secondary | ICD-10-CM | POA: Diagnosis not present

## 2023-01-13 DIAGNOSIS — Z1152 Encounter for screening for COVID-19: Secondary | ICD-10-CM | POA: Diagnosis not present

## 2023-01-13 DIAGNOSIS — K219 Gastro-esophageal reflux disease without esophagitis: Secondary | ICD-10-CM | POA: Diagnosis present

## 2023-01-13 DIAGNOSIS — Z79899 Other long term (current) drug therapy: Secondary | ICD-10-CM

## 2023-01-13 DIAGNOSIS — R0902 Hypoxemia: Secondary | ICD-10-CM

## 2023-01-13 DIAGNOSIS — J9621 Acute and chronic respiratory failure with hypoxia: Secondary | ICD-10-CM | POA: Diagnosis present

## 2023-01-13 DIAGNOSIS — F419 Anxiety disorder, unspecified: Secondary | ICD-10-CM | POA: Diagnosis present

## 2023-01-13 DIAGNOSIS — J441 Chronic obstructive pulmonary disease with (acute) exacerbation: Principal | ICD-10-CM | POA: Diagnosis present

## 2023-01-13 DIAGNOSIS — E876 Hypokalemia: Secondary | ICD-10-CM | POA: Diagnosis present

## 2023-01-13 DIAGNOSIS — F17210 Nicotine dependence, cigarettes, uncomplicated: Secondary | ICD-10-CM | POA: Diagnosis present

## 2023-01-13 LAB — CBC WITH DIFFERENTIAL/PLATELET
Abs Immature Granulocytes: 0.05 10*3/uL (ref 0.00–0.07)
Basophils Absolute: 0 10*3/uL (ref 0.0–0.1)
Basophils Relative: 0 %
Eosinophils Absolute: 0.3 10*3/uL (ref 0.0–0.5)
Eosinophils Relative: 3 %
HCT: 35.6 % — ABNORMAL LOW (ref 36.0–46.0)
Hemoglobin: 11.4 g/dL — ABNORMAL LOW (ref 12.0–15.0)
Immature Granulocytes: 1 %
Lymphocytes Relative: 15 %
Lymphs Abs: 1.3 10*3/uL (ref 0.7–4.0)
MCH: 27 pg (ref 26.0–34.0)
MCHC: 32 g/dL (ref 30.0–36.0)
MCV: 84.2 fL (ref 80.0–100.0)
Monocytes Absolute: 0.7 10*3/uL (ref 0.1–1.0)
Monocytes Relative: 8 %
Neutro Abs: 6.2 10*3/uL (ref 1.7–7.7)
Neutrophils Relative %: 73 %
Platelets: 318 10*3/uL (ref 150–400)
RBC: 4.23 MIL/uL (ref 3.87–5.11)
RDW: 13.4 % (ref 11.5–15.5)
WBC: 8.5 10*3/uL (ref 4.0–10.5)
nRBC: 0 % (ref 0.0–0.2)

## 2023-01-13 LAB — FERRITIN: Ferritin: 116 ng/mL (ref 11–307)

## 2023-01-13 LAB — BASIC METABOLIC PANEL
Anion gap: 12 (ref 5–15)
BUN: 12 mg/dL (ref 6–20)
CO2: 25 mmol/L (ref 22–32)
Calcium: 8.9 mg/dL (ref 8.9–10.3)
Chloride: 97 mmol/L — ABNORMAL LOW (ref 98–111)
Creatinine, Ser: 0.67 mg/dL (ref 0.44–1.00)
GFR, Estimated: >60 mL/min (ref >60)
Glucose, Bld: 117 mg/dL — ABNORMAL HIGH (ref 70–99)
Potassium: 3.2 mmol/L — ABNORMAL LOW (ref 3.5–5.1)
Sodium: 134 mmol/L — ABNORMAL LOW (ref 135–145)

## 2023-01-13 LAB — HIV ANTIBODY (ROUTINE TESTING W REFLEX): HIV Screen 4th Generation wRfx: NONREACTIVE

## 2023-01-13 LAB — IRON AND TIBC
Iron: 22 ug/dL — ABNORMAL LOW (ref 28–170)
Saturation Ratios: 8 % — ABNORMAL LOW (ref 10.4–31.8)
TIBC: 280 ug/dL (ref 250–450)
UIBC: 258 ug/dL

## 2023-01-13 LAB — FOLATE: Folate: 32.6 ng/mL (ref >5.9)

## 2023-01-13 LAB — VITAMIN B12: Vitamin B-12: 525 pg/mL (ref 180–914)

## 2023-01-13 LAB — RETICULOCYTES
Immature Retic Fract: 14.7 % (ref 2.3–15.9)
RBC.: 4.08 MIL/uL (ref 3.87–5.11)
Retic Count, Absolute: 55.9 10*3/uL (ref 19.0–186.0)
Retic Ct Pct: 1.4 % (ref 0.4–3.1)

## 2023-01-13 MED ORDER — LORATADINE 10 MG PO TABS
10.0000 mg | ORAL_TABLET | Freq: Every day | ORAL | Status: DC
  Administered 2023-01-13 – 2023-01-21 (×9): 10 mg via ORAL
  Filled 2023-01-13 (×9): qty 1

## 2023-01-13 MED ORDER — IPRATROPIUM-ALBUTEROL 0.5-2.5 (3) MG/3ML IN SOLN
3.0000 mL | Freq: Four times a day (QID) | RESPIRATORY_TRACT | Status: DC
  Administered 2023-01-13: 3 mL via RESPIRATORY_TRACT
  Filled 2023-01-13: qty 3

## 2023-01-13 MED ORDER — ALBUTEROL SULFATE (2.5 MG/3ML) 0.083% IN NEBU
2.5000 mg | INHALATION_SOLUTION | RESPIRATORY_TRACT | Status: DC | PRN
  Administered 2023-01-14 – 2023-01-21 (×9): 2.5 mg via RESPIRATORY_TRACT
  Filled 2023-01-13 (×9): qty 3

## 2023-01-13 MED ORDER — ACETAMINOPHEN 650 MG RE SUPP: 650.0000 mg | Freq: Four times a day (QID) | RECTAL | Status: DC | PRN

## 2023-01-13 MED ORDER — METHYLPREDNISOLONE SODIUM SUCC 40 MG IJ SOLR
40.0000 mg | Freq: Two times a day (BID) | INTRAMUSCULAR | Status: DC
  Administered 2023-01-13 – 2023-01-15 (×4): 40 mg via INTRAVENOUS
  Filled 2023-01-13 (×4): qty 1

## 2023-01-13 MED ORDER — ONDANSETRON HCL 4 MG/2ML IJ SOLN: 4.0000 mg | Freq: Four times a day (QID) | INTRAMUSCULAR | Status: DC | PRN

## 2023-01-13 MED ORDER — ONDANSETRON HCL 4 MG PO TABS: 4.0000 mg | ORAL_TABLET | Freq: Four times a day (QID) | ORAL | Status: DC | PRN

## 2023-01-13 MED ORDER — FLUTICASONE PROPIONATE 50 MCG/ACT NA SUSP
1.0000 | Freq: Every day | NASAL | Status: DC
  Administered 2023-01-13 – 2023-01-21 (×9): 1 via NASAL
  Filled 2023-01-13: qty 16

## 2023-01-13 MED ORDER — IPRATROPIUM-ALBUTEROL 0.5-2.5 (3) MG/3ML IN SOLN
3.0000 mL | Freq: Once | RESPIRATORY_TRACT | Status: AC
  Administered 2023-01-13: 3 mL via RESPIRATORY_TRACT
  Filled 2023-01-13: qty 3

## 2023-01-13 MED ORDER — METHYLPREDNISOLONE SODIUM SUCC 125 MG IJ SOLR: 125.0000 mg | Freq: Once | INTRAMUSCULAR | Status: DC

## 2023-01-13 MED ORDER — GUAIFENESIN-DM 100-10 MG/5ML PO SYRP: 5.0000 mL | ORAL_SOLUTION | ORAL | Status: DC | PRN

## 2023-01-13 MED ORDER — SODIUM CHLORIDE 0.9 % IV SOLN
500.0000 mg | Freq: Once | INTRAVENOUS | Status: AC
  Administered 2023-01-13: 500 mg via INTRAVENOUS
  Filled 2023-01-13: qty 5

## 2023-01-13 MED ORDER — BENZONATATE 100 MG PO CAPS
100.0000 mg | ORAL_CAPSULE | Freq: Three times a day (TID) | ORAL | Status: DC | PRN
  Administered 2023-01-14: 100 mg via ORAL
  Filled 2023-01-13 (×2): qty 1

## 2023-01-13 MED ORDER — TRAZODONE HCL 50 MG PO TABS
25.0000 mg | ORAL_TABLET | Freq: Every evening | ORAL | Status: DC | PRN
  Administered 2023-01-20: 25 mg via ORAL
  Filled 2023-01-13: qty 1

## 2023-01-13 MED ORDER — POTASSIUM CHLORIDE CRYS ER 20 MEQ PO TBCR
40.0000 meq | EXTENDED_RELEASE_TABLET | Freq: Once | ORAL | Status: AC
  Administered 2023-01-13: 40 meq via ORAL
  Filled 2023-01-13: qty 2

## 2023-01-13 MED ORDER — ACETAMINOPHEN 325 MG PO TABS: 650.0000 mg | ORAL_TABLET | Freq: Four times a day (QID) | ORAL | Status: DC | PRN

## 2023-01-13 MED ORDER — IPRATROPIUM-ALBUTEROL 0.5-2.5 (3) MG/3ML IN SOLN
3.0000 mL | RESPIRATORY_TRACT | Status: DC
  Administered 2023-01-13 – 2023-01-14 (×8): 3 mL via RESPIRATORY_TRACT
  Filled 2023-01-13 (×8): qty 3

## 2023-01-13 MED ORDER — ENOXAPARIN SODIUM 40 MG/0.4ML IJ SOSY
40.0000 mg | PREFILLED_SYRINGE | INTRAMUSCULAR | Status: DC
  Administered 2023-01-13 – 2023-01-21 (×9): 40 mg via SUBCUTANEOUS
  Filled 2023-01-13 (×9): qty 0.4

## 2023-01-13 NOTE — ED Notes (Signed)
ED TO INPATIENT HANDOFF REPORT  Name/Age/Gender Nicole Cohen 52 y.o. female  Code Status    Code Status Orders  (From admission, onward)           Start     Ordered   01/13/23 1239  Full code  Continuous       Question:  By:  Answer:  Consent: discussion documented in EHR   01/13/23 1239           Code Status History     This patient has a current code status but no historical code status.       Home/SNF/Other Home  Chief Complaint COPD with acute exacerbation (HCC) [J44.1]  Level of Care/Admitting Diagnosis ED Disposition     ED Disposition  Admit   Condition  --   Comment  Hospital Area: [facility] [[id number]]  Level of Care: Med-Surg [16]  May admit patient to [facility] or [facility] if equivalent level of care is available:: Yes  Covid Evaluation: Asymptomatic - no recent exposure (last 10 days) testing not required  Diagnosis: COPD with acute exacerbation [facility]) [[id number]]  Admitting Physician: Maryln Gottron [[id number]]  Attending Physician: Olexa.Dam, MIR Jaxson.Roy [[id number]]  Certification:: I certify this patient will need inpatient services for at least 2 midnights  Expected Medical Readiness: 01/15/2023          Medical History Past Medical History:  Diagnosis Date   Asthma    COPD (chronic obstructive pulmonary disease) (HCC)     Allergies Allergies  Allergen Reactions   Aspirin Itching and Nausea And Vomiting    IV Location/Drains/Wounds Patient Lines/Drains/Airways Status     Active Line/Drains/Airways     Name Placement date Placement time Site Days   Peripheral IV 01/13/23 20 G Right Antecubital 01/13/23  0925  Antecubital  less than 1            Labs/Imaging Results for orders placed or performed during the hospital encounter of 01/13/23 (from the past 48 hour(s))  CBC with Differential     Status: Abnormal   Collection Time: 01/13/23  9:31 AM  Result Value Ref Range   WBC 8.5 4.0 - 10.5 K/uL    RBC 4.23 3.87 - 5.11 MIL/uL   Hemoglobin 11.4 (L) 12.0 - 15.0 g/dL   HCT 65.7 (L) 84.6 - 96.2 %   MCV 84.2 80.0 - 100.0 fL   MCH 27.0 26.0 - 34.0 pg   MCHC 32.0 30.0 - 36.0 g/dL   RDW 95.2 84.1 - 32.4 %   Platelets 318 150 - 400 K/uL   nRBC 0.0 0.0 - 0.2 %   Neutrophils Relative % 73 %   Neutro Abs 6.2 1.7 - 7.7 K/uL   Lymphocytes Relative 15 %   Lymphs Abs 1.3 0.7 - 4.0 K/uL   Monocytes Relative 8 %   Monocytes Absolute 0.7 0.1 - 1.0 K/uL   Eosinophils Relative 3 %   Eosinophils Absolute 0.3 0.0 - 0.5 K/uL   Basophils Relative 0 %   Basophils Absolute 0.0 0.0 - 0.1 K/uL   Immature Granulocytes 1 %   Abs Immature Granulocytes 0.05 0.00 - 0.07 K/uL    Comment: Performed at [facility], [address], [place], [place] [address]  Basic metabolic panel     Status: Abnormal   Collection Time: 01/13/23  9:31 AM  Result Value Ref Range   Sodium 134 (L) 135 - 145 mmol/L   Potassium 3.2 (L) 3.5 -  5.1 mmol/L   Chloride 97 (L) 98 - 111 mmol/L   CO2 25 22 - 32 mmol/L   Glucose, Bld 117 (H) 70 - 99 mg/dL    Comment: Glucose reference range applies only to samples taken after fasting for at least 8 hours.   BUN 12 6 - 20 mg/dL   Creatinine, Ser 1.61 0.44 - 1.00 mg/dL   Calcium 8.9 8.9 - 09.6 mg/dL   GFR, Estimated >04 >54 mL/min    Comment: (NOTE) Calculated using the CKD-EPI Creatinine Equation (2021)    Anion gap 12 5 - 15    Comment: Performed at [facility], [address], [place], [place] [address]   DG Chest 1 View  Result Date: 01/13/2023 CLINICAL DATA:  Shortness of breath for 1 week. EXAM: CHEST  1 VIEW COMPARISON:  11/13/2019 FINDINGS: Heart size is normal. Low lung volumes are seen. Bibasilar infiltrates or atelectasis are new since previous study. No evidence pleural effusion. IMPRESSION: Low lung volumes, with bibasilar infiltrates or atelectasis. Electronically Signed   By: Danae Orleans M.D.   On: 01/13/2023 11:30    Pending  Labs Unresulted Labs (From admission, onward)     Start     Ordered   01/14/23 0500  Basic metabolic panel  Tomorrow morning,   R        01/13/23 1239   01/14/23 0500  CBC  Tomorrow morning,   R        01/13/23 1239   01/13/23 1256  Vitamin B12  (Anemia Panel (PNL))  Once,   R        01/13/23 1255   01/13/23 1256  Folate  (Anemia Panel (PNL))  Once,   R        01/13/23 1255   01/13/23 1256  Iron and TIBC  (Anemia Panel (PNL))  Once,   R        01/13/23 1255   01/13/23 1256  Ferritin  (Anemia Panel (PNL))  Once,   R        01/13/23 1255   01/13/23 1256  Reticulocytes  (Anemia Panel (PNL))  Once,   R        01/13/23 1255   01/13/23 1238  HIV Antibody (routine testing w rflx)  (HIV Antibody (Routine testing w reflex) panel)  Once,   R        01/13/23 1239   01/13/23 0939  SARS Coronavirus 2 by RT PCR (hospital order, performed in [facility] hospital lab) *cepheid single result test* Anterior Nasal Swab  Once,   URGENT        01/13/23 0938            Vitals/Pain Today's Vitals   01/13/23 0924 01/13/23 0929 01/13/23 1200 01/13/23 1230  BP:  105/78 103/72 111/69  Pulse:  (!) 120 (!) 112 (!) 116  Resp:  20 (!) 26 16  Temp:  98.3 F (36.8 C)    TempSrc:  Axillary    SpO2:  98% 98% 91%  PainSc: 0-No pain       Isolation Precautions No active isolations  Medications Medications  methylPREDNISolone sodium succinate (SOLU-MEDROL) 125 mg/2 mL injection 125 mg (125 mg Intravenous Not Given 01/13/23 0950)  azithromycin (ZITHROMAX) 500 mg in sodium chloride 0.9 % 250 mL IVPB (500 mg Intravenous New Bag/Given 01/13/23 1245)  potassium chloride SA (KLOR-CON M) CR tablet 40 mEq (has no administration in time range)  loratadine (CLARITIN) tablet 10 mg (has no administration  in time range)  fluticasone (FLONASE) 50 MCG/ACT nasal spray 1 spray (has no administration in time range)  enoxaparin (LOVENOX) injection 40 mg (has no administration in time range)  acetaminophen (TYLENOL)  tablet 650 mg (has no administration in time range)    Or  acetaminophen (TYLENOL) suppository 650 mg (has no administration in time range)  traZODone (DESYREL) tablet 25 mg (has no administration in time range)  ondansetron (ZOFRAN) tablet 4 mg (has no administration in time range)    Or  ondansetron (ZOFRAN) injection 4 mg (has no administration in time range)  albuterol (PROVENTIL) (2.5 MG/3ML) 0.083% nebulizer solution 2.5 mg (has no administration in time range)  ipratropium-albuterol (DUONEB) 0.5-2.5 (3) MG/3ML nebulizer solution 3 mL (has no administration in time range)  methylPREDNISolone sodium succinate (SOLU-MEDROL) 40 mg/mL injection 40 mg (has no administration in time range)  ipratropium-albuterol (DUONEB) 0.5-2.5 (3) MG/3ML nebulizer solution 3 mL (3 mLs Nebulization Given 01/13/23 0947)  ipratropium-albuterol (DUONEB) 0.5-2.5 (3) MG/3ML nebulizer solution 3 mL (3 mLs Nebulization Given 01/13/23 1127)    Mobility walks

## 2023-01-13 NOTE — H&P (Signed)
History and Physical  Nicole Cohen ZOX:[id number] DOB: 04-24-1970 DOA: 01/13/2023  PCP: [facility]   Chief Complaint: Cough, shortness of breath  HPI: Nicole Cohen is a 52 y.o. female with medical history significant for asthma, COPD on room air on Advair being admitted to the hospital with acute exacerbation of COPD.  Started having cough and shortness of breath about 10 days ago, went to see her pulmonologist at the [facility] clinic was given a shot of steroids and oral antibiotics.  Took these, did not significantly improved but was stable.  Starting about 3 days ago, she started having worsening cough, shortness of breath and dyspnea with exertion.  She does have oxygen at home as needed, has been wearing it for the last 3 days.  Denies any pleuritic chest pain, or fever.  No sputum production.  No sick contacts.  Tested negative for COVID and RSV at her pulmonologist's office.  ED Course: In the emergency department, she has been afebrile, but tachycardic.  Blood pressure low but stable.  Saturating 91% on 5 L nasal cannula currently.  Per ER provider, oxygen saturation dropped to 85% when oxygen was turned off.  CBC and BMP significant for potassium 3.2.  She was given a dose of IV Solu-Medrol, IV azithromycin and hospitalist contacted for admission.  Review of Systems: Please see HPI for pertinent positives and negatives. A complete 10 system review of systems are otherwise negative.  Past Medical History:  Diagnosis Date   Asthma    COPD (chronic obstructive pulmonary disease) (HCC)    Past Surgical History:  Procedure Laterality Date   LEFT HEART CATHETERIZATION WITH CORONARY ANGIOGRAM N/A 08/24/2012   Procedure: LEFT HEART CATHETERIZATION WITH CORONARY ANGIOGRAM;  Surgeon: Runell Gess, MD;  Location: [facility] CATH LAB;  Service: Cardiovascular;  Laterality: N/A;    Social History:  reports that she has been smoking cigarettes. She has a 25 pack-year smoking  history. She has never used smokeless tobacco. She reports that she does not currently use drugs. She reports that she does not drink alcohol.   Allergies  Allergen Reactions   Aspirin Itching and Nausea And Vomiting    Family History  Problem Relation Age of Onset   Heart disease Mother      Prior to Admission medications   Medication Sig Start Date End Date Taking? Authorizing Provider  albuterol (PROVENTIL) (2.5 MG/3ML) 0.083% nebulizer solution Take 3 mLs (2.5 mg total) by nebulization every 6 (six) hours as needed for wheezing. 12/25/19   Hall-Potvin, [place], PA-C  albuterol (VENTOLIN HFA) 108 (90 Base) MCG/ACT inhaler Inhale 2 puffs into the lungs every 6 (six) hours as needed for wheezing or shortness of breath. Please dispense PROAIR 12/25/19   Hall-Potvin, [place], PA-C  azithromycin (ZITHROMAX) 250 MG tablet Take 1 tablet (250 mg total) by mouth daily. Take first 2 tablets together, then 1 every day until finished. 12/25/19   Hall-Potvin, [place], PA-C  benzonatate (TESSALON) 100 MG capsule Take 1 capsule (100 mg total) by mouth every 8 (eight) hours. 11/13/19   Hall-Potvin, [place], PA-C  cetirizine (ZYRTEC ALLERGY) 10 MG tablet Take 1 tablet (10 mg total) by mouth daily. 12/25/19   Hall-Potvin, [place], PA-C  fluticasone (FLONASE) 50 MCG/ACT nasal spray Place 1 spray into both nostrils daily. 11/13/19   Hall-Potvin, [place], PA-C  naproxen sodium (ALEVE) 220 MG tablet Take 440 mg by mouth 2 (two) times daily as needed (pain).    [provider]  predniSONE (DELTASONE) 20  MG tablet Take 2 tablets (40 mg total) by mouth daily with breakfast. 07/17/21   Bing Neighbors, NP  Probiotic Product (PROBIOTIC PO) Take 1 tablet by mouth daily.    [provider]    Physical Exam: BP 111/69   Pulse (!) 116   Temp 98.3 F (36.8 C) (Axillary)   Resp 16   SpO2 91%   General:  Alert, oriented, calm, in no acute distress, wearing 5 L nasal cannula oxygen.  Speaking  in full sentences, no tachypnea, respiratory distress, no cough. Cardiovascular: RRR, no murmurs or rubs, no peripheral edema  Respiratory: She has good equal bilateral air entry, with moderate amount of diffuse rhonchi and wheezing. Abdomen: soft, nontender, nondistended, normal bowel tones heard  Skin: dry, no rashes  Musculoskeletal: no joint effusions, normal range of motion  Psychiatric: appropriate affect, normal speech  Neurologic: extraocular muscles intact, clear speech, moving all extremities with intact sensorium         Labs on Admission:  Basic Metabolic Panel: Recent Labs  Lab 01/13/23 0931  NA 134*  K 3.2*  CL 97*  CO2 25  GLUCOSE 117*  BUN 12  CREATININE 0.67  CALCIUM 8.9   Liver Function Tests: No results for input(s): "AST", "ALT", "ALKPHOS", "BILITOT", "PROT", "ALBUMIN" in the last 168 hours. No results for input(s): "LIPASE", "AMYLASE" in the last 168 hours. No results for input(s): "AMMONIA" in the last 168 hours. CBC: Recent Labs  Lab 01/13/23 0931  WBC 8.5  NEUTROABS 6.2  HGB 11.4*  HCT 35.6*  MCV 84.2  PLT 318   Cardiac Enzymes: No results for input(s): "CKTOTAL", "CKMB", "CKMBINDEX", "TROPONINI" in the last 168 hours.  BNP (last 3 results) No results for input(s): "BNP" in the last 8760 hours.  ProBNP (last 3 results) No results for input(s): "PROBNP" in the last 8760 hours.  CBG: No results for input(s): "GLUCAP" in the last 168 hours.  Radiological Exams on Admission: DG Chest 1 View  Result Date: 01/13/2023 CLINICAL DATA:  Shortness of breath for 1 week. EXAM: CHEST  1 VIEW COMPARISON:  11/13/2019 FINDINGS: Heart size is normal. Low lung volumes are seen. Bibasilar infiltrates or atelectasis are new since previous study. No evidence pleural effusion. IMPRESSION: Low lung volumes, with bibasilar infiltrates or atelectasis. Electronically Signed   By: Danae Orleans M.D.   On: 01/13/2023 11:30    Assessment/Plan Nicole Cohen is a  52 y.o. female with medical history significant for asthma, COPD on room air on Advair being admitted to the hospital with acute exacerbation of COPD.  Acute exacerbation of COPD-with increased cough, shortness of breath, dyspnea with exertion, and acute hypoxic respiratory failure -Inpatient admission -Supplemental oxygen to keep O2 saturation greater than 90% -Wean oxygen as able -Continue IV Solu-Medrol 40 mg every 12 hours -Incentive spirometry -Flutter valve -Scheduled DuoNebs and as needed albuterol -No further antibiotics for now, as she failed to improve with antibiotics, has no fevers or white count  Acute hypoxic respiratory failure-no convincing evidence of bacterial infection, previously tested negative for COVID and RSV.  Due to tachycardia I did consider PE, however patient has no risk factors, and presentation including pulmonary exam consistent with acute exacerbation of COPD.  Hypokalemia-mild, replete orally and recheck potassium level in the morning  Anemia-this is new since last labs in 2019.  Patient denies any blood loss. -Anemia panel  DVT prophylaxis: Lovenox     Code Status: Full Code  Consults called: None  Admission status:  The appropriate patient status for this patient is INPATIENT. Inpatient status is judged to be reasonable and necessary in order to provide the required intensity of service to ensure the patient's safety. The patient's presenting symptoms, physical exam findings, and initial radiographic and laboratory data in the context of their chronic comorbidities is felt to place them at high risk for further clinical deterioration. Furthermore, it is not anticipated that the patient will be medically stable for discharge from the hospital within 2 midnights of admission.    I certify that at the point of admission it is my clinical judgment that the patient will require inpatient hospital care spanning beyond 2 midnights from the point of admission due  to high intensity of service, high risk for further deterioration and high frequency of surveillance required  Time spent: 59 minutes  Anja Neuzil Sharlette Dense MD [facility] Pager [phone]  If 7PM-7AM, please contact night-coverage [email or web address] Password [facility]  01/13/2023, 12:39 PM

## 2023-01-13 NOTE — ED Triage Notes (Signed)
BIB [facility] c/o SOB x 1 week. CXR done outpt on Wed, negative. Pt did 3 duonebs prior to EMS arrival with no relief. EMS administered 17.5mg  Albuterol 0.5 Atrovent and 125mg  SoluMedrol

## 2023-01-13 NOTE — ED Provider Notes (Signed)
[place] EMERGENCY DEPARTMENT AT [facility] Provider Note  CSN: [id number] Arrival date & time: 01/13/23 4098  Chief Complaint(s) No chief complaint on file.  HPI Nicole Cohen is a 52 y.o. female here today for shortness of breath and cough.  She says that the symptoms have been ongoing for 1 week.  She saw her doctor 1 week ago for similar symptoms, was given "shots", some antibiotics but has not improved.  She has not had fever.  She has a history of COPD, does not chronically wear O2.   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 08/30/2012   Chest pain with moderate risk for cardiac etiology 08/24/2012   HLD (hyperlipidemia) 08/24/2012   COPD (chronic obstructive pulmonary disease) (HCC) 08/24/2012   Tobacco abuse 08/24/2012   Home Medication(s) Prior to Admission medications   Medication Sig Start Date End Date Taking? Authorizing Provider  albuterol (PROVENTIL) (2.5 MG/3ML) 0.083% nebulizer solution Take 3 mLs (2.5 mg total) by nebulization every 6 (six) hours as needed for wheezing. 12/25/19   Hall-Potvin, [place], PA-C  albuterol (VENTOLIN HFA) 108 (90 Base) MCG/ACT inhaler Inhale 2 puffs into the lungs every 6 (six) hours as needed for wheezing or shortness of breath. Please dispense PROAIR 12/25/19   Hall-Potvin, [place], PA-C  azithromycin (ZITHROMAX) 250 MG tablet Take 1 tablet (250 mg total) by mouth daily. Take first 2 tablets together, then 1 every day until finished. 12/25/19   Hall-Potvin, [place], PA-C  benzonatate (TESSALON) 100 MG capsule Take 1 capsule (100 mg total) by mouth every 8 (eight) hours. 11/13/19   Hall-Potvin, [place], PA-C  cetirizine (ZYRTEC ALLERGY) 10 MG tablet Take 1 tablet (10 mg total) by mouth daily.  12/25/19   Hall-Potvin, [place], PA-C  fluticasone (FLONASE) 50 MCG/ACT nasal spray Place 1 spray into both nostrils daily. 11/13/19   Hall-Potvin, [place], PA-C  naproxen sodium (ALEVE) 220 MG tablet Take 440 mg by mouth 2 (two) times daily as needed (pain).    [provider]  predniSONE (DELTASONE) 20 MG tablet Take 2 tablets (40 mg total) by mouth daily with breakfast. 07/17/21   Bing Neighbors, NP  Probiotic Product (PROBIOTIC PO) Take 1 tablet by mouth daily.    [provider]                                                                                                                                    Past Surgical History Past Surgical History:  Procedure Laterality Date   LEFT HEART CATHETERIZATION WITH CORONARY ANGIOGRAM N/A 08/24/2012   Procedure: LEFT HEART CATHETERIZATION WITH CORONARY ANGIOGRAM;  Surgeon: Runell Gess, MD;  Location: [facility] CATH LAB;  Service: Cardiovascular;  Laterality: N/A;   Family History Family History  Problem Relation Age of Onset   Heart disease Mother     Social History Social History   Tobacco Use   Smoking status: Heavy Smoker    Current packs/day: 1.00    Average packs/day: 1 pack/day for 25.0 years (25.0 ttl pk-yrs)    Types: Cigarettes   Smokeless tobacco: Never  Substance Use Topics   Alcohol use: No    Comment: Past history of heavy alcohol abuse. Sober x 5 years   Drug use: Not Currently   Allergies Aspirin  Review of Systems Review of Systems  Physical Exam Vital Signs  I have reviewed the triage vital signs BP 105/78 (BP Location: Left Arm)   Pulse (!) 120   Temp 98.3 F (36.8 C) (Axillary)   Resp 20   SpO2 98%   Physical Exam Cardiovascular:     Rate and Rhythm: Tachycardia present.     Pulses: Normal pulses.  Pulmonary:     Effort: Respiratory distress present.     Breath sounds: Wheezing present. No rhonchi.     Comments: Speaking in complete sentences Chest:     Chest wall: No  tenderness.  Neurological:     General: No focal deficit present.     Mental Status: She is alert.     ED Results and Treatments Labs (all labs ordered are listed, but only abnormal results are displayed) Labs Reviewed  CBC WITH DIFFERENTIAL/PLATELET  BASIC METABOLIC PANEL                                                                                                                          Radiology No results found.  Pertinent labs & imaging results that were available during my care of the patient were reviewed by me and considered in my medical decision making (see MDM for details).  Medications Ordered in ED Medications  ipratropium-albuterol (DUONEB) 0.5-2.5 (3) MG/3ML nebulizer solution 3 mL (has no administration in time range)  methylPREDNISolone sodium succinate (SOLU-MEDROL) 125 mg/2 mL injection 125 mg (has no administration in time range)                                                                                                                                     Procedures .Critical Care  Performed by: Arletha Pili, DO Authorized by: Arletha Pili, DO  Critical care provider statement:    Critical care time (minutes):  30   Critical care was necessary to treat or prevent imminent or life-threatening deterioration of the following conditions:  Respiratory failure   Critical care was time spent personally by me on the following activities:  Development of treatment plan with patient or surrogate, discussions with consultants, evaluation of patient's response to treatment, examination of patient, ordering and review of laboratory studies, ordering and review of radiographic studies, ordering and performing treatments and interventions, pulse oximetry, re-evaluation of patient's condition and review of old charts   (including critical care time)  Medical Decision Making / ED Course   This patient presents to the ED for concern of shortness of  breath, this involves an extensive number of treatment options, and is a complaint that carries with it a high risk of complications and morbidity.  The differential diagnosis includes COPD exacerbation, pneumonia, less likely pneumothorax, less likely PE, less likely ACS.  MDM: 52 year old female with history of COPD here for shortness of breath.  Based on exam, history, the patient continues to experience COPD exacerbation.  Steroids, DuoNeb ordered.  Chest x-ray ordered.  CBC and BMP ordered.  Will also obtain viral swab to see if this is causing acute exacerbation.  Based on patient's lung sounds, I think PE is less likely.  Patient does have a history of COPD, likely an exacerbation.  Reassessment 11:30 AM-patient's breathing has improved.  My independent review of the patient's chest x-ray shows no pneumonia.  I believe the patient's tachycardia is related to the albuterol that she has been receiving.  At this time, do not believe patient requires antibiotics.  Will likely discharge with steroid burst.  Will have her follow-up with her PCP.  Reassessment 12:30 PM-unfortunately, the patient's O2 saturation dropped to 85% when not on oxygen.  Have placed her on 4 L nasal cannula.  Will give azithromycin.  Will admit to hospitalist for COPD exacerbation.   EKG ordered.  Additional history obtained:  -External records from outside source obtained and reviewed including: Chart review including previous notes, labs, imaging, consultation notes   Lab Tests: -I ordered, reviewed, and interpreted labs.   The pertinent results include: No leukocytosis, no left shift. Labs Reviewed  CBC WITH DIFFERENTIAL/PLATELET  BASIC METABOLIC PANEL      EKG my dependent review the patient's EKG shows sinus tachycardia, no evidence of acute ischemia.  EKG Interpretation Date/Time:    Ventricular Rate:    PR Interval:    QRS Duration:    QT Interval:    QTC Calculation:   R Axis:      Text  Interpretation:           Imaging Studies ordered: I ordered imaging studies including chest x-ray I independently visualized and interpreted imaging. I agree with the radiologist interpretation   Medicines ordered and prescription drug management: Meds ordered this encounter  Medications   ipratropium-albuterol (DUONEB) 0.5-2.5 (3) MG/3ML nebulizer solution 3 mL   methylPREDNISolone sodium succinate (SOLU-MEDROL) 125 mg/2 mL injection 125 mg    IV methylprednisolone will be converted to either a q12h or q24h frequency with the same total daily dose (TDD).  Ordered Dose: 1 to 125 mg TDD; convert to: TDD q24h.  Ordered Dose: 126 to 250 mg TDD; convert to: TDD div q12h.  Ordered Dose: >250 mg TDD; DAW.    -I have reviewed the patients home medicines and have made adjustments as needed  Critical interventions Oxygen management.  Cardiac Monitoring: The patient was maintained on a cardiac monitor.  I personally viewed and interpreted the cardiac monitored which showed an underlying rhythm of: Sinus tachycardia.  Social Determinants of Health:  Factors impacting patients care include:    Reevaluation: After the interventions noted above, I reevaluated the patient and found that they have :stayed the same  Co morbidities that complicate the patient evaluation  Past Medical History:  Diagnosis Date   Asthma    COPD (chronic obstructive pulmonary disease) (HCC)       Dispostion: Admitted to hospitalist.     Final Clinical Impression(s) / ED Diagnoses Final diagnoses:  None     @PCDICTATION @    Anders Simmonds T, DO 01/13/23 1231

## 2023-01-13 NOTE — Plan of Care (Addendum)
Patient is alert and oriented X4, all admission questions completed. Patient admitted for shortness of breath and currently requesting Duo-neb. Respiratory called to come deliver it. Patient aware of plan of care and she notified her husband. PIV intact, VSS, complaining of 4/10 pain, denies any chest pain. Will continue to assess and monitor.  1520: Patients husband brought in home medications, list sent to MD Kirby Crigler and pharmacist to add to her daily dose. Patient educated on only taking medications provided by the hospital, not her home medication stack. Patient verbalized understanding and pharmacy to reconcile medication list.   "Her home dose is clonidine 0.1mg  tablet Q day, Lorazepam 1mg  3x/s a day, fluoxetine 40mg  daily, mirtazapine 30mg  daily and at bedtime, protonix Qday, dicyclomine 20mg  3x's daily, risperidone2mg  2 times daily."    Problem: Education: Goal: Knowledge of General Education information will improve Description: Including pain rating scale, medication(s)/side effects and non-pharmacologic comfort measures Outcome: Progressing   Problem: Health Behavior/Discharge Planning: Goal: Ability to manage health-related needs will improve Outcome: Progressing   Problem: Clinical Measurements: Goal: Ability to maintain clinical measurements within normal limits will improve Outcome: Progressing Goal: Will remain free from infection Outcome: Progressing Goal: Diagnostic test results will improve Outcome: Progressing Goal: Respiratory complications will improve Outcome: Progressing Goal: Cardiovascular complication will be avoided Outcome: Progressing   Problem: Activity: Goal: Risk for activity intolerance will decrease Outcome: Progressing   Problem: Nutrition: Goal: Adequate nutrition will be maintained Outcome: Progressing   Problem: Coping: Goal: Level of anxiety will decrease Outcome: Progressing   Problem: Elimination: Goal: Will not experience  complications related to bowel motility Outcome: Progressing Goal: Will not experience complications related to urinary retention Outcome: Progressing   Problem: Pain Managment: Goal: General experience of comfort will improve Outcome: Progressing   Problem: Safety: Goal: Ability to remain free from injury will improve Outcome: Progressing   Problem: Skin Integrity: Goal: Risk for impaired skin integrity will decrease Outcome: Progressing

## 2023-01-14 DIAGNOSIS — J9621 Acute and chronic respiratory failure with hypoxia: Secondary | ICD-10-CM | POA: Diagnosis not present

## 2023-01-14 DIAGNOSIS — K219 Gastro-esophageal reflux disease without esophagitis: Secondary | ICD-10-CM | POA: Diagnosis not present

## 2023-01-14 DIAGNOSIS — F17210 Nicotine dependence, cigarettes, uncomplicated: Secondary | ICD-10-CM | POA: Diagnosis not present

## 2023-01-14 DIAGNOSIS — E782 Mixed hyperlipidemia: Secondary | ICD-10-CM

## 2023-01-14 DIAGNOSIS — J441 Chronic obstructive pulmonary disease with (acute) exacerbation: Secondary | ICD-10-CM | POA: Diagnosis not present

## 2023-01-14 LAB — CBC
HCT: 34.7 % — ABNORMAL LOW (ref 36.0–46.0)
Hemoglobin: 11.1 g/dL — ABNORMAL LOW (ref 12.0–15.0)
MCH: 27.6 pg (ref 26.0–34.0)
MCHC: 32 g/dL (ref 30.0–36.0)
MCV: 86.3 fL (ref 80.0–100.0)
Platelets: 344 10*3/uL (ref 150–400)
RBC: 4.02 MIL/uL (ref 3.87–5.11)
RDW: 13.4 % (ref 11.5–15.5)
WBC: 11.9 10*3/uL — ABNORMAL HIGH (ref 4.0–10.5)
nRBC: 0 % (ref 0.0–0.2)

## 2023-01-14 LAB — BASIC METABOLIC PANEL
Anion gap: 13 (ref 5–15)
BUN: 12 mg/dL (ref 6–20)
CO2: 27 mmol/L (ref 22–32)
Calcium: 9.3 mg/dL (ref 8.9–10.3)
Chloride: 99 mmol/L (ref 98–111)
Creatinine, Ser: 0.43 mg/dL — ABNORMAL LOW (ref 0.44–1.00)
GFR, Estimated: >60 mL/min (ref >60)
Glucose, Bld: 149 mg/dL — ABNORMAL HIGH (ref 70–99)
Potassium: 4.3 mmol/L (ref 3.5–5.1)
Sodium: 139 mmol/L (ref 135–145)

## 2023-01-14 LAB — SARS CORONAVIRUS 2 BY RT PCR: SARS Coronavirus 2 by RT PCR: NEGATIVE

## 2023-01-14 MED ORDER — IPRATROPIUM-ALBUTEROL 0.5-2.5 (3) MG/3ML IN SOLN: 3.0000 mL | Freq: Four times a day (QID) | RESPIRATORY_TRACT | Status: DC

## 2023-01-14 MED ORDER — GUAIFENESIN-DM 100-10 MG/5ML PO SYRP
5.0000 mL | ORAL_SOLUTION | ORAL | Status: DC | PRN
  Administered 2023-01-14: 5 mL via ORAL
  Filled 2023-01-14: qty 5

## 2023-01-14 MED ORDER — LORAZEPAM 1 MG PO TABS
1.0000 mg | ORAL_TABLET | Freq: Every evening | ORAL | Status: DC | PRN
  Administered 2023-01-18: 1 mg via ORAL
  Filled 2023-01-14: qty 1

## 2023-01-14 MED ORDER — MIRTAZAPINE 30 MG PO TABS
45.0000 mg | ORAL_TABLET | Freq: Every day | ORAL | Status: DC
  Administered 2023-01-14 – 2023-01-20 (×7): 45 mg via ORAL
  Filled 2023-01-14 (×7): qty 1

## 2023-01-14 MED ORDER — DOXYCYCLINE HYCLATE 100 MG PO TABS
100.0000 mg | ORAL_TABLET | Freq: Two times a day (BID) | ORAL | Status: AC
  Administered 2023-01-15 – 2023-01-19 (×9): 100 mg via ORAL
  Filled 2023-01-14 (×10): qty 1

## 2023-01-14 MED ORDER — RISPERIDONE 1 MG PO TABS
1.0000 mg | ORAL_TABLET | Freq: Two times a day (BID) | ORAL | Status: DC
  Administered 2023-01-14 – 2023-01-21 (×14): 1 mg via ORAL
  Filled 2023-01-14 (×15): qty 1

## 2023-01-14 MED ORDER — LORAZEPAM 1 MG PO TABS
1.0000 mg | ORAL_TABLET | ORAL | Status: DC
  Administered 2023-01-14 – 2023-01-21 (×15): 1 mg via ORAL
  Filled 2023-01-14 (×15): qty 1

## 2023-01-14 MED ORDER — IPRATROPIUM-ALBUTEROL 0.5-2.5 (3) MG/3ML IN SOLN: 3.0000 mL | RESPIRATORY_TRACT | Status: DC

## 2023-01-14 MED ORDER — LORAZEPAM 1 MG PO TABS: 1.0000 mg | ORAL_TABLET | ORAL | Status: DC

## 2023-01-14 MED ORDER — FLUOXETINE HCL 20 MG PO CAPS
80.0000 mg | ORAL_CAPSULE | Freq: Every day | ORAL | Status: DC
  Administered 2023-01-14 – 2023-01-21 (×8): 80 mg via ORAL
  Filled 2023-01-14 (×8): qty 4

## 2023-01-14 MED ORDER — DICYCLOMINE HCL 20 MG PO TABS
20.0000 mg | ORAL_TABLET | Freq: Three times a day (TID) | ORAL | Status: DC
  Administered 2023-01-14 – 2023-01-21 (×22): 20 mg via ORAL
  Filled 2023-01-14 (×22): qty 1

## 2023-01-14 MED ORDER — NAPROXEN 250 MG PO TABS: 500.0000 mg | ORAL_TABLET | Freq: Two times a day (BID) | ORAL | Status: DC | PRN

## 2023-01-14 MED ORDER — PANTOPRAZOLE SODIUM 40 MG PO TBEC
40.0000 mg | DELAYED_RELEASE_TABLET | Freq: Every day | ORAL | Status: DC
  Administered 2023-01-15 – 2023-01-21 (×7): 40 mg via ORAL
  Filled 2023-01-14 (×7): qty 1

## 2023-01-14 NOTE — Assessment & Plan Note (Signed)
?  Counseling on cessation daily ?

## 2023-01-14 NOTE — Assessment & Plan Note (Addendum)
Remote history of hyperlipidemia although patient did not have a statin on her home medication reconciliation. Fasting lipid panel revealing LDL of 150 rosuvastatin 10 mg daily.

## 2023-01-14 NOTE — Assessment & Plan Note (Signed)
Continue daily Protonix

## 2023-01-14 NOTE — Assessment & Plan Note (Addendum)
Patient continues to experience substantial symptoms of shortness of breath and wheezing  CT chest performed 10/19 revealing diffuse groundglass infiltrates.  Obtaining pulmonology consultation, their input is appreciated Pulmonology recommending Pulmicort twice daily in addition to inhaled Breo daily.  They recommend that once patient is discharged a repeat CT scan should be performed in 4 to 6 weeks. Continuing aggressive bronchodilator therapy Continuing Solumedrol to 60mg  IV Q12hrs Continuing doxycycline twice daily, day 5 Continuing ceftriaxone, day 3. BNP unremarkable

## 2023-01-14 NOTE — Assessment & Plan Note (Addendum)
Thought to be secondary to COPD/asthma exacerbation Treating as above Patient continuing to experience increased oxygen requirement compared to baseline. Continuing to slowly wean as able

## 2023-01-14 NOTE — Hospital Course (Addendum)
52 year old female with past medical history of asthma and COPD, gastroesophageal reflux disease who presented to [facility] emergency department with a 3-day history of increasing shortness of breath and cough.   Upon evaluation, patient was found to be in acute hypoxic respiratory failure requiring initiation of supplemental oxygen.  Felt to be suffering from an exacerbation of her asthma and COPD.  Patient was initiated on intravenous Solu-Medrol as well as bronchodilator therapy and the hospitalist group was then called to assess the patient for admission to the hospital.  Patient experienced extremely slow clinical recovery throughout the hospitalization.  Patient was treated with a combination of high-dose steroids and aggressive bronchodilator therapy initially.  Several days into the hospitalization doxycycline was added.  On hospital day 3 repeat x-ray brought about concern for progressive infiltrates and therefore intravenous ceftriaxone was added.  On hospital day 4 CT imaging of the chest with contrast was performed revealing diffuse groundglass infiltrates and therefore on hospital day 5 pulmonology was consulted.    Pulmonology felt that the patient's complicated presentation may be secondary to a combination of asthma, bronchiolitis and mucous plugs.  They recommended addition of Pulmicort twice daily for 48 hours, Breo daily and close monitoring.  They recommended that after discharge the patient should undergo a repeat CT chest in 4 to 6 weeks.

## 2023-01-14 NOTE — Progress Notes (Signed)
PROGRESS NOTE   Nicole Cohen  NWG:[id number] DOB: [birth year] DOA: 01/13/2023 PCP: [facility]   Date of Service: the patient was seen and examined on 01/14/2023  Brief Narrative:  52 year old female with past medical history of asthma and COPD, gastroesophageal reflux disease who presented to [facility] emergency department with a 3-day history of increasing shortness of breath and cough.   Upon evaluation, patient was found to be in acute hypoxic respiratory failure requiring initiation of supplemental oxygen.  Felt to be suffering from an exacerbation of her asthma and COPD.  Patient was initiated on intravenous Solu-Medrol as well as bronchodilator therapy and the hospitalist group was then called to assess the patient for admission to the hospital.   Assessment and Plan: * COPD with acute exacerbation (HCC) Patient continues to experience substantial symptoms of shortness of breath and wheezing with continued oxygen requirement Continuing aggressive bronchodilator therapy Continuing systemic steroids Adding doxycycline twice daily in case of superimposed acute bacterial bronchitis. Considering remote history of depressed ejection fraction in 2014 obtaining BNP and will follow-up with echocardiogram if elevated  Acute on chronic respiratory failure with hypoxia [facility]) Patient presented with substantial increase in oxygen requirements compared to her baseline concerning for acute on chronic hypoxic respiratory failure Thought to be secondary to COPD exacerbation Weaning supplemental oxygen as able although patient still is experiencing increased oxygen need with evidence of hypoxia with ambulation  Nicotine dependence, cigarettes, uncomplicated Counseling on cessation daily  GERD without esophagitis Continue daily Protonix  Mixed hyperlipidemia Remote history of hyperlipidemia although patient is not currently on a statin. Will obtain fasting lipid panel  in the morning and if indicated will resume statin therapy    Subjective:  Patient continuing to complain of shortness of breath.  Patient describes her symptoms as severe in intensity, worse with minimal exertion and associated with ongoing cough occasionally productive with thick yellow sputum.  Patient denies any associated chest pain.  Physical Exam:  Vitals:   01/14/23 1413 01/14/23 1519 01/14/23 1921 01/14/23 2059  BP:    95/63  Pulse:      Resp:    16  Temp:    98.2 F (36.8 C)  TempSrc:    Oral  SpO2: (!) 88% 92% 93% 95%     Constitutional: Awake alert and oriented x3, patient is in mild distress due to shortness of breath. Skin: no rashes, no lesions, good skin turgor noted. Eyes: Pupils are equally reactive to light.  No evidence of scleral icterus or conjunctival pallor.  ENMT: Moist mucous membranes noted.  Posterior pharynx clear of any exudate or lesions.   Respiratory: Notable expiratory wheezing with prolonged expiratory phase.  No significant associated rales.  Normal respiratory effort. No accessory muscle use.  Cardiovascular: Regular rate and rhythm, no murmurs / rubs / gallops. No extremity edema. 2+ pedal pulses. No carotid bruits.  Abdomen: Abdomen is soft and nontender.  No evidence of intra-abdominal masses.  Positive bowel sounds noted in all quadrants.   Musculoskeletal: No joint deformity upper and lower extremities. Good ROM, no contractures. Normal muscle tone.    Data Reviewed:  I have personally reviewed and interpreted labs, imaging.  Significant findings are   CBC: Recent Labs  Lab 01/13/23 0931 01/14/23 0534  WBC 8.5 11.9*  NEUTROABS 6.2  --   HGB 11.4* 11.1*  HCT 35.6* 34.7*  MCV 84.2 86.3  PLT 318 344   Basic Metabolic Panel: Recent Labs  Lab 01/13/23 0931 01/14/23 0534  NA 134* 139  K 3.2* 4.3  CL 97* 99  CO2 25 27  GLUCOSE 117* 149*  BUN 12 12  CREATININE 0.67 0.43*  CALCIUM 8.9 9.3   GFR: CrCl cannot be  calculated (Unknown ideal weight.). Liver Function Tests: No results for input(s): "AST", "ALT", "ALKPHOS", "BILITOT", "PROT", "ALBUMIN" in the last 168 hours.  Coagulation Profile: No results for input(s): "INR", "PROTIME" in the last 168 hours.     Code Status:  Full code.  Code status decision has been confirmed with: patient    Severity of Illness:  The appropriate patient status for this patient is INPATIENT. Inpatient status is judged to be reasonable and necessary in order to provide the required intensity of service to ensure the patient's safety. The patient's presenting symptoms, physical exam findings, and initial radiographic and laboratory data in the context of their chronic comorbidities is felt to place them at high risk for further clinical deterioration. Furthermore, it is not anticipated that the patient will be medically stable for discharge from the hospital within 2 midnights of admission.   * I certify that at the point of admission it is my clinical judgment that the patient will require inpatient hospital care spanning beyond 2 midnights from the point of admission due to high intensity of service, high risk for further deterioration and high frequency of surveillance required.*  Time spent:  51 minutes  Author:  Marinda Elk MD  01/14/2023 9:45 PM

## 2023-01-15 DIAGNOSIS — J441 Chronic obstructive pulmonary disease with (acute) exacerbation: Secondary | ICD-10-CM | POA: Diagnosis not present

## 2023-01-15 DIAGNOSIS — J9621 Acute and chronic respiratory failure with hypoxia: Secondary | ICD-10-CM | POA: Diagnosis not present

## 2023-01-15 DIAGNOSIS — K219 Gastro-esophageal reflux disease without esophagitis: Secondary | ICD-10-CM | POA: Diagnosis not present

## 2023-01-15 DIAGNOSIS — F17210 Nicotine dependence, cigarettes, uncomplicated: Secondary | ICD-10-CM | POA: Diagnosis not present

## 2023-01-15 LAB — COMPREHENSIVE METABOLIC PANEL
ALT: 26 U/L (ref 0–44)
AST: 18 U/L (ref 15–41)
Albumin: 3 g/dL — ABNORMAL LOW (ref 3.5–5.0)
Alkaline Phosphatase: 100 U/L (ref 38–126)
Anion gap: 12 (ref 5–15)
BUN: 14 mg/dL (ref 6–20)
CO2: 27 mmol/L (ref 22–32)
Calcium: 9.2 mg/dL (ref 8.9–10.3)
Chloride: 99 mmol/L (ref 98–111)
Creatinine, Ser: 0.71 mg/dL (ref 0.44–1.00)
GFR, Estimated: >60 mL/min (ref >60)
Glucose, Bld: 126 mg/dL — ABNORMAL HIGH (ref 70–99)
Potassium: 4.6 mmol/L (ref 3.5–5.1)
Sodium: 138 mmol/L (ref 135–145)
Total Bilirubin: 0.4 mg/dL (ref 0.3–1.2)
Total Protein: 6.8 g/dL (ref 6.5–8.1)

## 2023-01-15 LAB — D-DIMER, QUANTITATIVE: D-Dimer, Quant: 0.39 ug{FEU}/mL (ref 0.00–0.50)

## 2023-01-15 LAB — CBC WITH DIFFERENTIAL/PLATELET
Abs Immature Granulocytes: 0.15 10*3/uL — ABNORMAL HIGH (ref 0.00–0.07)
Basophils Absolute: 0 10*3/uL (ref 0.0–0.1)
Basophils Relative: 0 %
Eosinophils Absolute: 0 10*3/uL (ref 0.0–0.5)
Eosinophils Relative: 0 %
HCT: 37.7 % (ref 36.0–46.0)
Hemoglobin: 11.2 g/dL — ABNORMAL LOW (ref 12.0–15.0)
Immature Granulocytes: 1 %
Lymphocytes Relative: 8 %
Lymphs Abs: 0.9 10*3/uL (ref 0.7–4.0)
MCH: 26.7 pg (ref 26.0–34.0)
MCHC: 29.7 g/dL — ABNORMAL LOW (ref 30.0–36.0)
MCV: 89.8 fL (ref 80.0–100.0)
Monocytes Absolute: 0.6 10*3/uL (ref 0.1–1.0)
Monocytes Relative: 5 %
Neutro Abs: 10.5 10*3/uL — ABNORMAL HIGH (ref 1.7–7.7)
Neutrophils Relative %: 86 %
Platelets: 384 10*3/uL (ref 150–400)
RBC: 4.2 MIL/uL (ref 3.87–5.11)
RDW: 13.8 % (ref 11.5–15.5)
WBC: 12.2 10*3/uL — ABNORMAL HIGH (ref 4.0–10.5)
nRBC: 0 % (ref 0.0–0.2)

## 2023-01-15 LAB — LIPID PANEL
Cholesterol: 229 mg/dL — ABNORMAL HIGH (ref 0–200)
HDL: 64 mg/dL (ref >40)
LDL Cholesterol: 150 mg/dL — ABNORMAL HIGH (ref 0–99)
Total CHOL/HDL Ratio: 3.6 {ratio}
Triglycerides: 73 mg/dL (ref <150)
VLDL: 15 mg/dL (ref 0–40)

## 2023-01-15 LAB — BRAIN NATRIURETIC PEPTIDE: B Natriuretic Peptide: 58.1 pg/mL (ref 0.0–100.0)

## 2023-01-15 LAB — MAGNESIUM: Magnesium: 2.3 mg/dL (ref 1.7–2.4)

## 2023-01-15 LAB — PROCALCITONIN: Procalcitonin: <0.1 ng/mL

## 2023-01-15 MED ORDER — METHYLPREDNISOLONE SODIUM SUCC 125 MG IJ SOLR
60.0000 mg | Freq: Two times a day (BID) | INTRAMUSCULAR | Status: DC
  Administered 2023-01-16 – 2023-01-20 (×9): 60 mg via INTRAVENOUS
  Filled 2023-01-15 (×10): qty 2

## 2023-01-15 MED ORDER — ROSUVASTATIN CALCIUM 10 MG PO TABS
10.0000 mg | ORAL_TABLET | Freq: Every day | ORAL | Status: DC
  Administered 2023-01-15 – 2023-01-21 (×7): 10 mg via ORAL
  Filled 2023-01-15 (×7): qty 1

## 2023-01-15 MED ORDER — IPRATROPIUM-ALBUTEROL 0.5-2.5 (3) MG/3ML IN SOLN
3.0000 mL | RESPIRATORY_TRACT | Status: DC
  Administered 2023-01-15 – 2023-01-17 (×16): 3 mL via RESPIRATORY_TRACT
  Filled 2023-01-15 (×16): qty 3

## 2023-01-15 NOTE — Progress Notes (Signed)
PROGRESS NOTE   Nicole Cohen  JOA:[id number] DOB: [birth year] DOA: 01/13/2023 PCP: [facility]   Date of Service: the patient was seen and examined on 01/15/2023  Brief Narrative:  52 year old female with past medical history of asthma and COPD, gastroesophageal reflux disease who presented to [facility] emergency department with a 3-day history of increasing shortness of breath and cough.   Upon evaluation, patient was found to be in acute hypoxic respiratory failure requiring initiation of supplemental oxygen.  Felt to be suffering from an exacerbation of her asthma and COPD.  Patient was initiated on intravenous Solu-Medrol as well as bronchodilator therapy and the hospitalist group was then called to assess the patient for admission to the hospital.   Assessment and Plan: * COPD with acute exacerbation [facility]) Patient continues to experience substantial symptoms of shortness of breath and wheezing with continued oxygen requirement Continuing aggressive bronchodilator therapy Increasing Solumedrol to 60mg  IV Q12hrs Continuing doxycycline twice daily, day 2 of 5 in case of superimposed acute bacterial bronchitis. BNP unremarkable, procalcitonin normal.  Acute on chronic respiratory failure with hypoxia [facility]) Patient presented with substantial increase in oxygen requirements compared to her baseline concerning for acute on chronic hypoxic respiratory failure Thought to be secondary to COPD exacerbation Patient continuing to experience increased oxygen requirement compared to baseline. Continuing to slowly wean as able  Nicotine dependence, cigarettes, uncomplicated Counseling on cessation daily  GERD without esophagitis Continue daily Protonix  Mixed hyperlipidemia Remote history of hyperlipidemia although patient did not have a statin on her home medication reconciliation. Fasting lipid panel revealing LDL of 150 Initiating rosuvastatin 10 mg  daily.    Subjective:  Patient continuing to complain of shortness of breath.  Shortness of breath is moderate severe in intensity, worse with minimal exertion, associated with intermittent cough and associated with wheezing.  Patient states that the associated cough is improving.  Physical Exam:  Vitals:   01/15/23 0749 01/15/23 1501 01/15/23 1915 01/15/23 2154  BP:  111/68  107/74  Pulse:  (!) 105  (!) 110  Resp:  15  20  Temp:  98.4 F (36.9 C)  98.6 F (37 C)  TempSrc:  Oral  Oral  SpO2: 97% 95% 96% 94%     Constitutional: Awake alert and oriented x3, no associated distress.   Skin: no rashes, no lesions, good skin turgor noted. Eyes: Pupils are equally reactive to light.  No evidence of scleral icterus or conjunctival pallor.  ENMT: Moist mucous membranes noted.  Posterior pharynx clear of any exudate or lesions.   Respiratory: Expiratory wheezing noted in all fields with prolonged expiratory phase.  No evidence of associated rales.  Normal respiratory effort. No accessory muscle use.  Cardiovascular: Regular rate and rhythm, no murmurs / rubs / gallops. No extremity edema. 2+ pedal pulses. No carotid bruits.  Abdomen: Abdomen is soft and nontender.  No evidence of intra-abdominal masses.  Positive bowel sounds noted in all quadrants.   Musculoskeletal: No joint deformity upper and lower extremities. Good ROM, no contractures. Normal muscle tone.    Data Reviewed:  I have personally reviewed and interpreted labs, imaging.  Significant findings are   CBC: Recent Labs  Lab 01/13/23 0931 01/14/23 0534 01/15/23 0544  WBC 8.5 11.9* 12.2*  NEUTROABS 6.2  --  10.5*  HGB 11.4* 11.1* 11.2*  HCT 35.6* 34.7* 37.7  MCV 84.2 86.3 89.8  PLT 318 344 384   Basic Metabolic Panel: Recent Labs  Lab 01/13/23 0931 01/14/23  0534 01/15/23 0544  NA 134* 139 138  K 3.2* 4.3 4.6  CL 97* 99 99  CO2 25 27 27   GLUCOSE 117* 149* 126*  BUN 12 12 14   CREATININE 0.67 0.43* 0.71   CALCIUM 8.9 9.3 9.2  MG  --   --  2.3   GFR: CrCl cannot be calculated (Unknown ideal weight.). Liver Function Tests: Recent Labs  Lab 01/15/23 0544  AST 18  ALT 26  ALKPHOS 100  BILITOT 0.4  PROT 6.8  ALBUMIN 3.0*     Code Status:  Full code.  Code status decision has been confirmed with: patient    Severity of Illness:  The appropriate patient status for this patient is INPATIENT. Inpatient status is judged to be reasonable and necessary in order to provide the required intensity of service to ensure the patient's safety. The patient's presenting symptoms, physical exam findings, and initial radiographic and laboratory data in the context of their chronic comorbidities is felt to place them at high risk for further clinical deterioration. Furthermore, it is not anticipated that the patient will be medically stable for discharge from the hospital within 2 midnights of admission.   * I certify that at the point of admission it is my clinical judgment that the patient will require inpatient hospital care spanning beyond 2 midnights from the point of admission due to high intensity of service, high risk for further deterioration and high frequency of surveillance required.*  Time spent:  40 minutes  Author:  Marinda Elk MD  01/15/2023 10:37 PM

## 2023-01-16 ENCOUNTER — Inpatient Hospital Stay (HOSPITAL_COMMUNITY): Payer: Medicaid Other

## 2023-01-16 DIAGNOSIS — F17210 Nicotine dependence, cigarettes, uncomplicated: Secondary | ICD-10-CM | POA: Diagnosis not present

## 2023-01-16 DIAGNOSIS — K219 Gastro-esophageal reflux disease without esophagitis: Secondary | ICD-10-CM | POA: Diagnosis not present

## 2023-01-16 DIAGNOSIS — J441 Chronic obstructive pulmonary disease with (acute) exacerbation: Secondary | ICD-10-CM | POA: Diagnosis not present

## 2023-01-16 DIAGNOSIS — J9621 Acute and chronic respiratory failure with hypoxia: Secondary | ICD-10-CM | POA: Diagnosis not present

## 2023-01-16 LAB — CBC WITH DIFFERENTIAL/PLATELET
Abs Immature Granulocytes: 0.14 10*3/uL — ABNORMAL HIGH (ref 0.00–0.07)
Basophils Absolute: 0 10*3/uL (ref 0.0–0.1)
Basophils Relative: 0 %
Eosinophils Absolute: 0 10*3/uL (ref 0.0–0.5)
Eosinophils Relative: 0 %
HCT: 35.8 % — ABNORMAL LOW (ref 36.0–46.0)
Hemoglobin: 10.7 g/dL — ABNORMAL LOW (ref 12.0–15.0)
Immature Granulocytes: 2 %
Lymphocytes Relative: 7 %
Lymphs Abs: 0.7 10*3/uL (ref 0.7–4.0)
MCH: 26.6 pg (ref 26.0–34.0)
MCHC: 29.9 g/dL — ABNORMAL LOW (ref 30.0–36.0)
MCV: 89.1 fL (ref 80.0–100.0)
Monocytes Absolute: 0.3 10*3/uL (ref 0.1–1.0)
Monocytes Relative: 3 %
Neutro Abs: 8 10*3/uL — ABNORMAL HIGH (ref 1.7–7.7)
Neutrophils Relative %: 88 %
Platelets: 379 10*3/uL (ref 150–400)
RBC: 4.02 MIL/uL (ref 3.87–5.11)
RDW: 13.9 % (ref 11.5–15.5)
WBC: 9.1 10*3/uL (ref 4.0–10.5)
nRBC: 0 % (ref 0.0–0.2)

## 2023-01-16 LAB — COMPREHENSIVE METABOLIC PANEL
ALT: 27 U/L (ref 0–44)
AST: 19 U/L (ref 15–41)
Albumin: 2.9 g/dL — ABNORMAL LOW (ref 3.5–5.0)
Alkaline Phosphatase: 103 U/L (ref 38–126)
Anion gap: 12 (ref 5–15)
BUN: 15 mg/dL (ref 6–20)
CO2: 27 mmol/L (ref 22–32)
Calcium: 8.8 mg/dL — ABNORMAL LOW (ref 8.9–10.3)
Chloride: 97 mmol/L — ABNORMAL LOW (ref 98–111)
Creatinine, Ser: 0.67 mg/dL (ref 0.44–1.00)
GFR, Estimated: >60 mL/min (ref >60)
Glucose, Bld: 168 mg/dL — ABNORMAL HIGH (ref 70–99)
Potassium: 4.2 mmol/L (ref 3.5–5.1)
Sodium: 136 mmol/L (ref 135–145)
Total Bilirubin: 0.2 mg/dL — ABNORMAL LOW (ref 0.3–1.2)
Total Protein: 6.4 g/dL — ABNORMAL LOW (ref 6.5–8.1)

## 2023-01-16 LAB — C-REACTIVE PROTEIN: CRP: 7.8 mg/dL — ABNORMAL HIGH (ref <1.0)

## 2023-01-16 LAB — MAGNESIUM: Magnesium: 2.2 mg/dL (ref 1.7–2.4)

## 2023-01-16 MED ORDER — SODIUM CHLORIDE 0.9 % IV SOLN
2.0000 g | INTRAVENOUS | Status: DC
  Administered 2023-01-16 – 2023-01-21 (×6): 2 g via INTRAVENOUS
  Filled 2023-01-16 (×6): qty 20

## 2023-01-16 NOTE — Progress Notes (Addendum)
SATURATION QUALIFICATIONS: (This note is used to comply with regulatory documentation for home oxygen)  Patient Saturations on Room Air at Rest = 92%  Patient Saturations on Room Air while Ambulating = 88%  Patient Saturations on 3 Liters of oxygen while Ambulating = 94%  Dr. Leafy Half made aware.

## 2023-01-17 ENCOUNTER — Inpatient Hospital Stay (HOSPITAL_COMMUNITY): Payer: Medicaid Other

## 2023-01-17 DIAGNOSIS — J9621 Acute and chronic respiratory failure with hypoxia: Secondary | ICD-10-CM | POA: Diagnosis not present

## 2023-01-17 DIAGNOSIS — J441 Chronic obstructive pulmonary disease with (acute) exacerbation: Secondary | ICD-10-CM | POA: Diagnosis not present

## 2023-01-17 DIAGNOSIS — K219 Gastro-esophageal reflux disease without esophagitis: Secondary | ICD-10-CM | POA: Diagnosis not present

## 2023-01-17 DIAGNOSIS — F17210 Nicotine dependence, cigarettes, uncomplicated: Secondary | ICD-10-CM | POA: Diagnosis not present

## 2023-01-17 LAB — RESPIRATORY PANEL BY PCR

## 2023-01-17 MED ORDER — IPRATROPIUM-ALBUTEROL 0.5-2.5 (3) MG/3ML IN SOLN
3.0000 mL | Freq: Four times a day (QID) | RESPIRATORY_TRACT | Status: DC
  Administered 2023-01-17 – 2023-01-20 (×10): 3 mL via RESPIRATORY_TRACT
  Filled 2023-01-17 (×10): qty 3

## 2023-01-17 MED ORDER — IOHEXOL 300 MG/ML  SOLN
100.0000 mL | Freq: Once | INTRAMUSCULAR | Status: AC | PRN
  Administered 2023-01-17: 100 mL via INTRAVENOUS

## 2023-01-17 NOTE — Plan of Care (Signed)
  Problem: Education: Goal: Knowledge of General Education information will improve Description: Including pain rating scale, medication(s)/side effects and non-pharmacologic comfort measures Outcome: Progressing   Problem: Health Behavior/Discharge Planning: Goal: Ability to manage health-related needs will improve Outcome: Progressing   Problem: Activity: Goal: Risk for activity intolerance will decrease Outcome: Progressing   Problem: Elimination: Goal: Will not experience complications related to bowel motility Outcome: Progressing   Problem: Safety: Goal: Ability to remain free from injury will improve Outcome: Progressing   Problem: Skin Integrity: Goal: Risk for impaired skin integrity will decrease Outcome: Progressing   Problem: Activity: Goal: Ability to tolerate increased activity will improve Outcome: Progressing   Problem: Respiratory: Goal: Ability to maintain a clear airway will improve Outcome: Progressing

## 2023-01-17 NOTE — Progress Notes (Signed)
PROGRESS NOTE   Nicole Cohen  WUJ:[id number] DOB: 28-Jul-1970 DOA: 01/13/2023 PCP: [facility]   Date of Service: the patient was seen and examined on 01/17/2023  Brief Narrative:  52 year old female with past medical history of asthma and COPD, gastroesophageal reflux disease who presented to [facility] emergency department with a 3-day history of increasing shortness of breath and cough.   Upon evaluation, patient was found to be in acute hypoxic respiratory failure requiring initiation of supplemental oxygen.  Felt to be suffering from an exacerbation of her asthma and COPD.  Patient was initiated on intravenous Solu-Medrol as well as bronchodilator therapy and the hospitalist group was then called to assess the patient for admission to the hospital.   Assessment and Plan: * COPD with acute exacerbation [facility]) Patient continues to experience substantial symptoms of shortness of breath and wheezing with continued oxygen requirement Continuing aggressive bronchodilator therapy Increasing Solumedrol to 60mg  IV Q12hrs Continuing doxycycline twice daily, day 2 of 5 in case of superimposed acute bacterial bronchitis. BNP unremarkable, procalcitonin normal.  Acute on chronic respiratory failure with hypoxia [facility]) Patient presented with substantial increase in oxygen requirements compared to her baseline concerning for acute on chronic hypoxic respiratory failure Thought to be secondary to COPD exacerbation Patient continuing to experience increased oxygen requirement compared to baseline. Continuing to slowly wean as able  Nicotine dependence, cigarettes, uncomplicated Counseling on cessation daily  GERD without esophagitis Continue daily Protonix  Mixed hyperlipidemia Remote history of hyperlipidemia although patient did not have a statin on her home medication reconciliation. Fasting lipid panel revealing LDL of 150 Initiating rosuvastatin 10 mg  daily.    Subjective:  Patient continuing to complain of shortness of breath.  Shortness of breath is moderate severe in intensity, worse with minimal exertion, associated with intermittent cough and associated with wheezing.  Patient states that the associated cough is improving.  Physical Exam:  Vitals:   01/17/23 0410 01/17/23 0642 01/17/23 0644 01/17/23 0814  BP:  109/73 109/73   Pulse:  (!) 101 93   Resp:  20 20   Temp:  (!) 97.5 F (36.4 C) (!) 97.5 F (36.4 C)   TempSrc:  Oral Oral   SpO2: 94% 94%  93%     Constitutional: Awake alert and oriented x3, no associated distress.   Skin: no rashes, no lesions, good skin turgor noted. Eyes: Pupils are equally reactive to light.  No evidence of scleral icterus or conjunctival pallor.  ENMT: Moist mucous membranes noted.  Posterior pharynx clear of any exudate or lesions.   Respiratory: Expiratory wheezing noted in all fields with prolonged expiratory phase.  No evidence of associated rales.  Normal respiratory effort. No accessory muscle use.  Cardiovascular: Regular rate and rhythm, no murmurs / rubs / gallops. No extremity edema. 2+ pedal pulses. No carotid bruits.  Abdomen: Abdomen is soft and nontender.  No evidence of intra-abdominal masses.  Positive bowel sounds noted in all quadrants.   Musculoskeletal: No joint deformity upper and lower extremities. Good ROM, no contractures. Normal muscle tone.    Data Reviewed:  I have personally reviewed and interpreted labs, imaging.  Significant findings are   CBC: Recent Labs  Lab 01/13/23 0931 01/14/23 0534 01/15/23 0544 01/16/23 0539  WBC 8.5 11.9* 12.2* 9.1  NEUTROABS 6.2  --  10.5* 8.0*  HGB 11.4* 11.1* 11.2* 10.7*  HCT 35.6* 34.7* 37.7 35.8*  MCV 84.2 86.3 89.8 89.1  PLT 318 344 384 379   Basic  Metabolic Panel: Recent Labs  Lab 01/13/23 0931 01/14/23 0534 01/15/23 0544 01/16/23 0539  NA 134* 139 138 136  K 3.2* 4.3 4.6 4.2  CL 97* 99 99 97*  CO2 25 27  27 27   GLUCOSE 117* 149* 126* 168*  BUN 12 12 14 15   CREATININE 0.67 0.43* 0.71 0.67  CALCIUM 8.9 9.3 9.2 8.8*  MG  --   --  2.3 2.2   GFR: CrCl cannot be calculated (Unknown ideal weight.). Liver Function Tests: Recent Labs  Lab 01/15/23 0544 01/16/23 0539  AST 18 19  ALT 26 27  ALKPHOS 100 103  BILITOT 0.4 0.2*  PROT 6.8 6.4*  ALBUMIN 3.0* 2.9*     Code Status:  Full code.  Code status decision has been confirmed with: patient    Severity of Illness:  The appropriate patient status for this patient is INPATIENT. Inpatient status is judged to be reasonable and necessary in order to provide the required intensity of service to ensure the patient's safety. The patient's presenting symptoms, physical exam findings, and initial radiographic and laboratory data in the context of their chronic comorbidities is felt to place them at high risk for further clinical deterioration. Furthermore, it is not anticipated that the patient will be medically stable for discharge from the hospital within 2 midnights of admission.   * I certify that at the point of admission it is my clinical judgment that the patient will require inpatient hospital care spanning beyond 2 midnights from the point of admission due to high intensity of service, high risk for further deterioration and high frequency of surveillance required.*  Time spent:  40 minutes  Author:  Marinda Elk MD  01/17/2023 10:26 AM

## 2023-01-18 DIAGNOSIS — K219 Gastro-esophageal reflux disease without esophagitis: Secondary | ICD-10-CM | POA: Diagnosis not present

## 2023-01-18 DIAGNOSIS — J9621 Acute and chronic respiratory failure with hypoxia: Secondary | ICD-10-CM | POA: Diagnosis not present

## 2023-01-18 DIAGNOSIS — F17210 Nicotine dependence, cigarettes, uncomplicated: Secondary | ICD-10-CM | POA: Diagnosis not present

## 2023-01-18 DIAGNOSIS — J441 Chronic obstructive pulmonary disease with (acute) exacerbation: Secondary | ICD-10-CM | POA: Diagnosis not present

## 2023-01-18 LAB — COMPREHENSIVE METABOLIC PANEL WITH GFR
ALT: 21 U/L (ref 0–44)
AST: 12 U/L — ABNORMAL LOW (ref 15–41)
Albumin: 2.8 g/dL — ABNORMAL LOW (ref 3.5–5.0)
Alkaline Phosphatase: 96 U/L (ref 38–126)
Anion gap: 8 (ref 5–15)
BUN: 15 mg/dL (ref 6–20)
CO2: 31 mmol/L (ref 22–32)
Calcium: 9.2 mg/dL (ref 8.9–10.3)
Chloride: 102 mmol/L (ref 98–111)
Creatinine, Ser: 0.58 mg/dL (ref 0.44–1.00)
GFR, Estimated: >60 mL/min (ref >60)
Glucose, Bld: 141 mg/dL — ABNORMAL HIGH (ref 70–99)
Potassium: 4.3 mmol/L (ref 3.5–5.1)
Sodium: 141 mmol/L (ref 135–145)
Total Bilirubin: 0.5 mg/dL (ref 0.3–1.2)
Total Protein: 6.6 g/dL (ref 6.5–8.1)

## 2023-01-18 LAB — CBC WITH DIFFERENTIAL/PLATELET
Abs Immature Granulocytes: 0.22 K/uL — ABNORMAL HIGH (ref 0.00–0.07)
Basophils Absolute: 0 K/uL (ref 0.0–0.1)
Basophils Relative: 0 %
Eosinophils Absolute: 0 K/uL (ref 0.0–0.5)
Eosinophils Relative: 0 %
HCT: 36.5 % (ref 36.0–46.0)
Hemoglobin: 11.1 g/dL — ABNORMAL LOW (ref 12.0–15.0)
Immature Granulocytes: 2 %
Lymphocytes Relative: 9 %
Lymphs Abs: 0.9 K/uL (ref 0.7–4.0)
MCH: 26.7 pg (ref 26.0–34.0)
MCHC: 30.4 g/dL (ref 30.0–36.0)
MCV: 88 fL (ref 80.0–100.0)
Monocytes Absolute: 0.4 K/uL (ref 0.1–1.0)
Monocytes Relative: 4 %
Neutro Abs: 8 K/uL — ABNORMAL HIGH (ref 1.7–7.7)
Neutrophils Relative %: 85 %
Platelets: 409 K/uL — ABNORMAL HIGH (ref 150–400)
RBC: 4.15 MIL/uL (ref 3.87–5.11)
RDW: 14 % (ref 11.5–15.5)
WBC: 9.5 K/uL (ref 4.0–10.5)
nRBC: 0 % (ref 0.0–0.2)

## 2023-01-18 LAB — MAGNESIUM: Magnesium: 2.2 mg/dL (ref 1.7–2.4)

## 2023-01-18 LAB — PHOSPHORUS: Phosphorus: 4.5 mg/dL (ref 2.5–4.6)

## 2023-01-18 LAB — C-REACTIVE PROTEIN: CRP: 2.8 mg/dL — ABNORMAL HIGH (ref <1.0)

## 2023-01-18 MED ORDER — BUDESONIDE 0.25 MG/2ML IN SUSP
0.2500 mg | Freq: Two times a day (BID) | RESPIRATORY_TRACT | Status: AC
  Administered 2023-01-18 – 2023-01-20 (×4): 0.25 mg via RESPIRATORY_TRACT
  Filled 2023-01-18 (×4): qty 2

## 2023-01-18 MED ORDER — FLUTICASONE FUROATE-VILANTEROL 200-25 MCG/ACT IN AEPB
1.0000 | INHALATION_SPRAY | Freq: Every day | RESPIRATORY_TRACT | Status: DC
  Administered 2023-01-19 – 2023-01-21 (×3): 1 via RESPIRATORY_TRACT
  Filled 2023-01-18: qty 28

## 2023-01-18 MED ORDER — BUDESONIDE 0.25 MG/2ML IN SUSP
0.2500 mg | Freq: Two times a day (BID) | RESPIRATORY_TRACT | Status: DC
  Administered 2023-01-18: 0.25 mg via RESPIRATORY_TRACT
  Filled 2023-01-18: qty 2

## 2023-01-18 MED ORDER — BUDESONIDE 0.25 MG/2ML IN SUSP: 0.2500 mg | Freq: Two times a day (BID) | RESPIRATORY_TRACT | Status: DC

## 2023-01-18 NOTE — Progress Notes (Signed)
   01/18/23 0607  Assess: MEWS Score  Temp (!) 97.4 F (36.3 C)  BP 126/77  MAP (mmHg) 90  Pulse Rate (!) 105  Resp (!) 26  SpO2 92 %  O2 Device Nasal Cannula  O2 Flow Rate (L/min) 3 L/min  Assess: MEWS Score  MEWS Temp 0  MEWS Systolic 0  MEWS Pulse 1  MEWS RR 2  MEWS LOC 0  MEWS Score 3  MEWS Score Color Yellow  Assess: if the MEWS score is Yellow or Red  Were vital signs accurate and taken at a resting state? Yes  Does the patient meet 2 or more of the SIRS criteria? Yes  Does the patient have a confirmed or suspected source of infection? Yes  MEWS guidelines implemented  Yes, yellow  Treat  MEWS Interventions Considered administering scheduled or prn medications/treatments as ordered  Take Vital Signs  Increase Vital Sign Frequency  Yellow: Q2hr x1, continue Q4hrs until patient remains green for 12hrs  Escalate  MEWS: Escalate Yellow: Discuss with charge nurse and consider notifying provider and/or RRT  Notify: Charge Nurse/RN  Name of Charge Nurse/RN Notified 2nd RN Riki Rusk notified.  Provider Notification  Provider Name/Title Virgel Manifold  Date Provider Notified 01/18/23  Time Provider Notified [phone]  Method of Notification Page  Notification Reason Other (Comment) (yellow MEWs)  Provider response See new orders  Date of Provider Response 01/18/23  Time of Provider Response 0630  Notify: Rapid Response  Name of Rapid Response RN Notified Janelle, RN  Date Rapid Response Notified 01/18/23  Time Rapid Response Notified 0630  Assess: SIRS CRITERIA  SIRS Temperature  0  SIRS Pulse 1  SIRS Respirations  1  SIRS WBC 0  SIRS Score Sum  2

## 2023-01-18 NOTE — Plan of Care (Signed)

## 2023-01-18 NOTE — Consult Note (Signed)
NAME:  Nicole Cohen, MRN:  [id number], DOB:  09-18-1970, LOS: 5 ADMISSION DATE:  01/13/2023, CONSULTATION DATE:  01/18/23 REFERRING MD:  Shauna Hugh, MD, CHIEF COMPLAINT:  Wheezing   History of Present Illness:  52 year old female active smoker with COPD/asthma, anxiety and reflux admitted for COPD exacerbation on [facility] service. Initially presented with 10 day history of shortness of breath and cough that worsened in the last 3 days. Treated outpatient with antibiotics and IM steroids without improvement. Started on oxygen for hypoxemia to 85% and treated with steroids, doxycycline and nebulizers for COPD exacerbation. D-dimer and procal neg. Patient admitted on 10/15 however continues to have shortness of breath and wheezing despite medical management.  CT chest with multifocal GGO opacities with central distribution., moderate hiatal hernia, central bronchial thickening with mucoid impaction. RVP neg. Solumedrol was increased to 120 mg total daily. PCCM consulted for recommendations.  She is on Advair at home Diskus 250-50 that she recently started. Continues to have productive cough associated with wheezing  Pertinent  Medical History  COPD/asthma, HLD and reflux  Significant Hospital Events: Including procedures, antibiotic start and stop dates in addition to other pertinent events   10/15 Admitted 10/20 PCCM consulted  Interim History / Subjective:  As above  Objective   Blood pressure 106/68, pulse (!) 101, temperature 97.9 F (36.6 C), temperature source Oral, resp. rate 14, SpO2 96%.    FiO2 (%):  [32 %] 32 %   Intake/Output Summary (Last 24 hours) at 01/18/2023 1327 Last data filed at 01/17/2023 1900 Gross per 24 hour  Intake 240 ml  Output --  Net 240 ml   There were no vitals filed for this visit.  Physical Exam: General: Well-appearing, no acute distress HENT: [place], AT Eyes: EOMI, no scleral icterus Respiratory: Bilateral wheezing Cardiovascular: RRR, -M/R/G,  no JVD Extremities:-Edema,-tenderness Neuro: AAO x4, CNII-XII grossly intact Psych: Normal mood, normal affect   Resolved Hospital Problem list   N/A  Assessment & Plan:   COPD exacerbation -No PFTs on file Acute hypoxemic respiratory failure 2/2 above Wheezing may be secondary to asthma, bronchiolitis, mucous plugs in setting of probable viral illness -Reviewed and discussed CT scan with patient. Suspect inflammatory/infectious cause. Will need repeat CT in 4-6 weeks to monitor for resolution -Wean supplemental oxygen for goal >88% -Start Pulmicort BID x 48 hours. -Start Breo 200 ONE puff ONCE a day while inpatient. Resume home Advair at discharge -Continue solumedrol 60 mg BID -Continue scheduled Duonebs -Complete doxycycline course   Reflux  -Protonix daily  Anxiety -Home lorazepam 1 mg TID -Fluoxetine 40 mg daily  Best Practice (right click and "Reselect all SmartList Selections" daily)   Diet/type: Regular consistency (see orders) DVT prophylaxis: LMWH GI prophylaxis: PPI Lines: N/A Foley:  N/A Code Status:  full code Last date of multidisciplinary goals of care discussion [Per primary]  Labs   CBC: Recent Labs  Lab 01/13/23 0931 01/14/23 0534 01/15/23 0544 01/16/23 0539 01/18/23 0559  WBC 8.5 11.9* 12.2* 9.1 9.5  NEUTROABS 6.2  --  10.5* 8.0* 8.0*  HGB 11.4* 11.1* 11.2* 10.7* 11.1*  HCT 35.6* 34.7* 37.7 35.8* 36.5  MCV 84.2 86.3 89.8 89.1 88.0  PLT 318 344 384 379 409*    Basic Metabolic Panel: Recent Labs  Lab 01/13/23 0931 01/14/23 0534 01/15/23 0544 01/16/23 0539 01/18/23 0559  NA 134* 139 138 136 141  K 3.2* 4.3 4.6 4.2 4.3  CL 97* 99 99 97* 102  CO2 25 27 27  27 31  GLUCOSE 117* 149* 126* 168* 141*  BUN 12 12 14 15 15   CREATININE 0.67 0.43* 0.71 0.67 0.58  CALCIUM 8.9 9.3 9.2 8.8* 9.2  MG  --   --  2.3 2.2 2.2  PHOS  --   --   --   --  4.5   GFR: CrCl cannot be calculated (Unknown ideal weight.). Recent Labs  Lab 01/14/23 0534  01/15/23 0544 01/16/23 0539 01/18/23 0559  PROCALCITON  --  <0.10  --   --   WBC 11.9* 12.2* 9.1 9.5    Liver Function Tests: Recent Labs  Lab 01/15/23 0544 01/16/23 0539 01/18/23 0559  AST 18 19 12*  ALT 26 27 21   ALKPHOS 100 103 96  BILITOT 0.4 0.2* 0.5  PROT 6.8 6.4* 6.6  ALBUMIN 3.0* 2.9* 2.8*   No results for input(s): "LIPASE", "AMYLASE" in the last 168 hours. No results for input(s): "AMMONIA" in the last 168 hours.  ABG No results found for: "PHART", "PCO2ART", "PO2ART", "HCO3", "TCO2", "ACIDBASEDEF", "O2SAT"   Coagulation Profile: No results for input(s): "INR", "PROTIME" in the last 168 hours.  Cardiac Enzymes: No results for input(s): "CKTOTAL", "CKMB", "CKMBINDEX", "TROPONINI" in the last 168 hours.  HbA1C: No results found for: "HGBA1C"  CBG: No results for input(s): "GLUCAP" in the last 168 hours.  Review of Systems:   Review of Systems  Constitutional:  Negative for chills, diaphoresis, fever, malaise/fatigue and weight loss.  HENT:  Negative for congestion.   Respiratory:  Positive for cough, sputum production, shortness of breath and wheezing. Negative for hemoptysis.   Cardiovascular:  Negative for chest pain, palpitations and leg swelling.     Past Medical History:  She,  has a past medical history of Asthma and COPD (chronic obstructive pulmonary disease) (HCC).   Surgical History:   Past Surgical History:  Procedure Laterality Date   LEFT HEART CATHETERIZATION WITH CORONARY ANGIOGRAM N/A 08/24/2012   Procedure: LEFT HEART CATHETERIZATION WITH CORONARY ANGIOGRAM;  Surgeon: Runell Gess, MD;  Location: [facility] CATH LAB;  Service: Cardiovascular;  Laterality: N/A;     Social History:   reports that she has been smoking cigarettes. She has a 25 pack-year smoking history. She has never used smokeless tobacco. She reports that she does not currently use drugs. She reports that she does not drink alcohol.   Family History:  Her family history  includes Heart disease in her mother.   Allergies Allergies  Allergen Reactions   Aspirin Shortness Of Breath, Itching, Nausea And Vomiting and Other (See Comments)    Also: abdominal pain, GI Intolerance Per the patient, she has aspirin-sensitive asthma     Home Medications  Prior to Admission medications   Medication Sig Start Date End Date Taking? Authorizing Provider  ADVAIR DISKUS 250-50 MCG/ACT AEPB Inhale 1 puff into the lungs in the morning and at bedtime.   Yes [provider]  albuterol (PROVENTIL) (2.5 MG/3ML) 0.083% nebulizer solution Take 3 mLs (2.5 mg total) by nebulization every 6 (six) hours as needed for wheezing. 12/25/19  Yes Hall-Potvin, [place], PA-C  albuterol (VENTOLIN HFA) 108 (90 Base) MCG/ACT inhaler Inhale 2 puffs into the lungs every 6 (six) hours as needed for wheezing or shortness of breath. Please dispense PROAIR Patient taking differently: Inhale 2 puffs into the lungs See admin instructions. Inhale 2 puffs into the lungs every 3-4 hours as needed for wheezing or shortness of breath 12/25/19  Yes Hall-Potvin, [place], PA-C  cloNIDine (CATAPRES) 0.1 MG  tablet Take 0.1 mg by mouth daily.   Yes [provider]  dicyclomine (BENTYL) 20 MG tablet Take 20 mg by mouth in the morning, at noon, and at bedtime.   Yes [provider]  FLUoxetine (PROZAC) 40 MG capsule Take 80 mg by mouth daily.   Yes [provider]  fluticasone (FLONASE) 50 MCG/ACT nasal spray Place 1 spray into both nostrils daily. 11/13/19  Yes Hall-Potvin, [place], PA-C  LORazepam (ATIVAN) 1 MG tablet Take 1 mg by mouth See admin instructions. Take 1 mg by mouth in the morning & 2 PM- and an additional 1 mg at bedtime as needed for anxiety or sleep   Yes [provider]  mirtazapine (REMERON) 30 MG tablet Take 45 mg by mouth at bedtime.   Yes [provider]  naproxen sodium (ALEVE) 220 MG tablet Take 440 mg by mouth 2 (two) times daily as needed  (pain).   Yes [provider]  OXYGEN Inhale 2-3 L/min into the lungs as needed (for shortness of breath).   Yes [provider]  pantoprazole (PROTONIX) 40 MG tablet Take 40 mg by mouth daily before breakfast.   Yes [provider]  Probiotic Product (PROBIOTIC PO) Take 1 capsule by mouth daily.   Yes [provider]  risperiDONE (RISPERDAL) 1 MG tablet Take 1 mg by mouth in the morning and at bedtime.   Yes [provider]  azithromycin (ZITHROMAX) 250 MG tablet Take 1 tablet (250 mg total) by mouth daily. Take first 2 tablets together, then 1 every day until finished. Patient not taking: Reported on 01/13/2023 12/25/19   Hall-Potvin, [place], PA-C  benzonatate (TESSALON) 100 MG capsule Take 1 capsule (100 mg total) by mouth every 8 (eight) hours. Patient not taking: Reported on 01/13/2023 11/13/19   Hall-Potvin, [place], PA-C  cetirizine (ZYRTEC ALLERGY) 10 MG tablet Take 1 tablet (10 mg total) by mouth daily. Patient not taking: Reported on 01/13/2023 12/25/19   Hall-Potvin, [place], PA-C  predniSONE (DELTASONE) 20 MG tablet Take 2 tablets (40 mg total) by mouth daily with breakfast. Patient not taking: Reported on 01/13/2023 07/17/21   Bing Neighbors, NP     Critical care time: N/A    Care Time: 60 min Discussed plan with primary team  Mechele Collin, M.D. [facility] Pulmonary/Critical Care Medicine 01/18/2023 1:28 PM   See Amion for personal pager For hours between 7 PM to 7 AM, please call Elink for urgent questions

## 2023-01-19 DIAGNOSIS — J441 Chronic obstructive pulmonary disease with (acute) exacerbation: Secondary | ICD-10-CM | POA: Diagnosis not present

## 2023-01-19 LAB — CBC WITH DIFFERENTIAL/PLATELET
Abs Immature Granulocytes: 0.2 10*3/uL — ABNORMAL HIGH (ref 0.00–0.07)
Basophils Absolute: 0 10*3/uL (ref 0.0–0.1)
Basophils Relative: 0 %
Eosinophils Absolute: 0 10*3/uL (ref 0.0–0.5)
Eosinophils Relative: 0 %
HCT: 37.2 % (ref 36.0–46.0)
Hemoglobin: 11.3 g/dL — ABNORMAL LOW (ref 12.0–15.0)
Immature Granulocytes: 2 %
Lymphocytes Relative: 8 %
Lymphs Abs: 0.9 10*3/uL (ref 0.7–4.0)
MCH: 26.7 pg (ref 26.0–34.0)
MCHC: 30.4 g/dL (ref 30.0–36.0)
MCV: 87.9 fL (ref 80.0–100.0)
Monocytes Absolute: 0.3 10*3/uL (ref 0.1–1.0)
Monocytes Relative: 2 %
Neutro Abs: 9.5 10*3/uL — ABNORMAL HIGH (ref 1.7–7.7)
Neutrophils Relative %: 88 %
Platelets: 404 10*3/uL — ABNORMAL HIGH (ref 150–400)
RBC: 4.23 MIL/uL (ref 3.87–5.11)
RDW: 14 % (ref 11.5–15.5)
WBC: 10.8 10*3/uL — ABNORMAL HIGH (ref 4.0–10.5)
nRBC: 0 % (ref 0.0–0.2)

## 2023-01-19 LAB — COMPREHENSIVE METABOLIC PANEL
ALT: 22 U/L (ref 0–44)
AST: 17 U/L (ref 15–41)
Albumin: 2.9 g/dL — ABNORMAL LOW (ref 3.5–5.0)
Alkaline Phosphatase: 103 U/L (ref 38–126)
Anion gap: 12 (ref 5–15)
BUN: 15 mg/dL (ref 6–20)
CO2: 29 mmol/L (ref 22–32)
Calcium: 9.1 mg/dL (ref 8.9–10.3)
Chloride: 98 mmol/L (ref 98–111)
Creatinine, Ser: 0.61 mg/dL (ref 0.44–1.00)
GFR, Estimated: >60 mL/min (ref >60)
Glucose, Bld: 137 mg/dL — ABNORMAL HIGH (ref 70–99)
Potassium: 4.5 mmol/L (ref 3.5–5.1)
Sodium: 139 mmol/L (ref 135–145)
Total Bilirubin: 0.1 mg/dL — ABNORMAL LOW (ref 0.3–1.2)
Total Protein: 6.4 g/dL — ABNORMAL LOW (ref 6.5–8.1)

## 2023-01-19 LAB — PHOSPHORUS: Phosphorus: 4.6 mg/dL (ref 2.5–4.6)

## 2023-01-19 LAB — MAGNESIUM: Magnesium: 2.2 mg/dL (ref 1.7–2.4)

## 2023-01-19 NOTE — Progress Notes (Signed)
PROGRESS NOTE   Nicole Cohen  AVW:[id number] DOB: 02-28-1971 DOA: 01/13/2023 PCP: [facility]   Date of Service: the patient was seen and examined on 01/17/2023  Brief Narrative:  52 year old female with past medical history of asthma and COPD, gastroesophageal reflux disease who presented to [facility] emergency department with a 3-day history of increasing shortness of breath and cough.   Upon evaluation, patient was found to be in acute hypoxic respiratory failure requiring initiation of supplemental oxygen.  Felt to be suffering from an exacerbation of her asthma and COPD.  Patient was initiated on intravenous Solu-Medrol as well as bronchodilator therapy and the hospitalist group was then called to assess the patient for admission to the hospital.   Assessment and Plan: * Acute exacerbation of COPD with asthma (HCC) Patient continues to experience substantial symptoms of shortness of breath and wheezing  Persisting oxygen requirement, weaning as able. Continuing aggressive bronchodilator therapy Continuing Solumedrol to 60mg  IV Q12hrs Continuing doxycycline twice daily, day 4 Continuing ceftriaxone, day 2.. BNP unremarkable Obtaining CT chest with contrast to better evaluate etiology of persisting symptoms  Acute on chronic respiratory failure with hypoxia (HCC) Thought to be secondary to COPD/asthma exacerbation Treating as above Patient continuing to experience increased oxygen requirement compared to baseline. Continuing to slowly wean as able  Nicotine dependence, cigarettes, uncomplicated Counseling on cessation daily  GERD without esophagitis Continue daily Protonix  Mixed hyperlipidemia Remote history of hyperlipidemia although patient did not have a statin on her home medication reconciliation. Fasting lipid panel revealing LDL of 150 rosuvastatin 10 mg daily.     Subjective:  Patient continuing to experience substantial shortness  of breath, moderate to severe in intensity, worse with exertion and improved with rest associate with ongoing wheezing.  Cough has improved.  Physical Exam:  BP 114/74 HR 108 RR 18 Temp 36.7F   Constitutional: Awake alert and oriented x3, no associated distress.   Skin: no rashes, no lesions, good skin turgor noted. Eyes: Pupils are equally reactive to light.  No evidence of scleral icterus or conjunctival pallor.  ENMT: Moist mucous membranes noted.  Posterior pharynx clear of any exudate or lesions.   Respiratory: Expiratory wheezing noted with prolonged expiratory phase.  Normal respiratory effort. No accessory muscle use.  Cardiovascular: Regular rate and rhythm, no murmurs / rubs / gallops. No extremity edema. 2+ pedal pulses. No carotid bruits.  Abdomen: Abdomen is soft and nontender.  No evidence of intra-abdominal masses.  Positive bowel sounds noted in all quadrants.   Musculoskeletal: No joint deformity upper and lower extremities. Good ROM, no contractures. Normal muscle tone.    Data Reviewed:  I have personally reviewed and interpreted labs, imaging.  Significant findings are   CBC: Recent Labs  Lab 01/13/23 0931 01/14/23 0534 01/15/23 0544 01/16/23 0539   WBC 8.5 11.9* 12.2* 9.1   NEUTROABS 6.2  --  10.5* 8.0*   HGB 11.4* 11.1* 11.2* 10.7*   HCT 35.6* 34.7* 37.7 35.8*   MCV 84.2 86.3 89.8 89.1   PLT 318 344 384 379    Basic Metabolic Panel: Recent Labs  Lab 01/13/23 0931 01/14/23 0534 01/15/23 0544 01/16/23 0539   NA 134* 139 138 136   K 3.2* 4.3 4.6 4.2   CL 97* 99 99 97*   CO2 25 27 27 27    GLUCOSE 117* 149* 126* 168*   BUN 12 12 14 15    CREATININE 0.67 0.43* 0.71 0.67   CALCIUM 8.9 9.3 9.2  8.8*   MG  --   --  2.3 2.2   PHOS  --   --   --   --     GFR: CrCl cannot be calculated (Unknown ideal weight.). Liver Function Tests: Recent Labs  Lab 01/15/23 0544 01/16/23 0539   AST 18 19   ALT 26 27   ALKPHOS 100 103   BILITOT 0.4 0.2*   PROT  6.8 6.4*   ALBUMIN 3.0* 2.9*      Code Status:  Full code.  Code status decision has been confirmed with: patient    Severity of Illness:  The appropriate patient status for this patient is INPATIENT. Inpatient status is judged to be reasonable and necessary in order to provide the required intensity of service to ensure the patient's safety. The patient's presenting symptoms, physical exam findings, and initial radiographic and laboratory data in the context of their chronic comorbidities is felt to place them at high risk for further clinical deterioration. Furthermore, it is not anticipated that the patient will be medically stable for discharge from the hospital within 2 midnights of admission.   * I certify that at the point of admission it is my clinical judgment that the patient will require inpatient hospital care spanning beyond 2 midnights from the point of admission due to high intensity of service, high risk for further deterioration and high frequency of surveillance required.*  Time spent:  41 minutes  Author:  Marinda Elk MD  01/17/2023

## 2023-01-19 NOTE — Progress Notes (Signed)
NAME:  Nicole Cohen, MRN:  [id number], DOB:  [birth year], LOS: 6 ADMISSION DATE:  01/13/2023, CONSULTATION DATE:  01/18/23 REFERRING MD:  Shauna Hugh, MD, CHIEF COMPLAINT:  Wheezing   History of Present Illness:   52 year old female active smoker with COPD/asthma, anxiety and reflux admitted for COPD exacerbation on [facility] service. Initially presented with 10 day history of shortness of breath and cough that worsened in the last 3 days. Treated outpatient with antibiotics and IM steroids without improvement. Started on oxygen for hypoxemia to 85% and treated with steroids, doxycycline and nebulizers for COPD exacerbation. D-dimer and procal neg. Patient admitted on 10/15 however continues to have shortness of breath and wheezing despite medical management.  CT chest with multifocal GGO opacities with central distribution., moderate hiatal hernia, central bronchial thickening with mucoid impaction. RVP neg. Solumedrol was increased to 120 mg total daily. PCCM consulted for recommendations.  She is on Advair at home Diskus 250-50 that she recently started. Continues to have productive cough associated with wheezing  Pertinent  Medical History  COPD/asthma, HLD and reflux  Significant Hospital Events: Including procedures, antibiotic start and stop dates in addition to other pertinent events   10/15 Admitted 10/20 PCCM consulted  Interim History / Subjective:   Feels better with less wheezing.  Objective   Blood pressure 131/89, pulse (!) 105, temperature 97.8 F (36.6 C), temperature source Oral, resp. rate 16, height 5\' 1"  (1.549 m), weight 65.3 kg, SpO2 94%.    FiO2 (%):  [32 %] 32 %   Intake/Output Summary (Last 24 hours) at 01/19/2023 0816 Last data filed at 01/19/2023 0550 Gross per 24 hour  Intake 680 ml  Output --  Net 680 ml   Filed Weights   01/19/23 0523  Weight: 65.3 kg    Physical Exam: Blood pressure 131/89, pulse (!) 105, temperature 97.8 F (36.6 C),  temperature source Oral, resp. rate 16, height 5\' 1"  (1.549 m), weight 65.3 kg, SpO2 94%. Gen:      No acute distress HEENT:  EOMI, sclera anicteric Neck:     No masses; no thyromegaly Lungs:    Clear to auscultation bilaterally; normal respiratory effort CV:         Regular rate and rhythm; no murmurs Abd:      + bowel sounds; soft, non-tender; no palpable masses, no distension Ext:    No edema; adequate peripheral perfusion Skin:      Warm and dry; no rash Neuro: alert and oriented x 3 Psych: normal mood and affect   Lab/imaging reviewed Significant for glucose 137 BUN/creatinine 15/0.61 WBC 10.8, platelets 404   Resolved Hospital Problem list   N/A  Assessment & Plan:   COPD exacerbation -No PFTs on file Acute hypoxemic respiratory failure 2/2 above Wheezing may be secondary to asthma, bronchiolitis, mucous plugs in setting of probable viral illness Continue Pulmicort, Breo Solu-Medrol at current dose of 60 mg twice daily DuoNebs On doxycycline Will need follow-up CT 4 to 6 weeks.  She follows with Dr. Tomasita Morrow at [facility] for pulmonary.  Reflux  -Protonix daily  Anxiety -Home lorazepam 1 mg TID -Fluoxetine 40 mg daily  Best Practice (right click and "Reselect all SmartList Selections" daily)   Diet/type: Regular consistency (see orders) DVT prophylaxis: LMWH GI prophylaxis: PPI Lines: N/A Foley:  N/A Code Status:  full code Last date of multidisciplinary goals of care discussion [Per primary]  Signature:   Chilton Greathouse MD [facility] See  Amion for pager  If no response to pager , please call [phone] until 7pm After 7:00 pm call Elink  [phone] 01/19/2023, 9:23 AM

## 2023-01-19 NOTE — Plan of Care (Signed)
  Problem: Education: Goal: Knowledge of General Education information will improve Description: Including pain rating scale, medication(s)/side effects and non-pharmacologic comfort measures Outcome: Progressing   Problem: Health Behavior/Discharge Planning: Goal: Ability to manage health-related needs will improve Outcome: Progressing   Problem: Clinical Measurements: Goal: Ability to maintain clinical measurements within normal limits will improve Outcome: Progressing Goal: Will remain free from infection Outcome: Progressing Goal: Diagnostic test results will improve Outcome: Progressing Goal: Respiratory complications will improve Outcome: Progressing   Problem: Activity: Goal: Risk for activity intolerance will decrease Outcome: Progressing   Problem: Coping: Goal: Level of anxiety will decrease Outcome: Progressing   Problem: Pain Managment: Goal: General experience of comfort will improve Outcome: Progressing   Problem: Skin Integrity: Goal: Risk for impaired skin integrity will decrease Outcome: Progressing   Problem: Education: Goal: Knowledge of disease or condition will improve Outcome: Progressing   Problem: Activity: Goal: Ability to tolerate increased activity will improve Outcome: Progressing   Problem: Respiratory: Goal: Ability to maintain a clear airway will improve Outcome: Progressing Goal: Ability to maintain adequate ventilation will improve Outcome: Progressing

## 2023-01-19 NOTE — Progress Notes (Signed)
PROGRESS NOTE    TANELL WITTSTRUCK  ZOX:[id number] DOB: 05-Aug-1970 DOA: 01/13/2023 PCP: [facility]   Brief Narrative:  52 year old female with past medical history of asthma and COPD, gastroesophageal reflux disease who presented to [facility] emergency department with a 3-day history of increasing shortness of breath and cough.    Upon evaluation, patient was found to be in acute hypoxic respiratory failure requiring initiation of supplemental oxygen.  Felt to be suffering from an exacerbation of her asthma and COPD.  Patient was initiated on intravenous Solu-Medrol as well as bronchodilator therapy and the hospitalist group was then called to assess the patient for admission to the hospital.   Patient experienced extremely slow clinical recovery throughout the hospitalization.  Patient was treated with a combination of high-dose steroids and aggressive bronchodilator therapy initially.  Several days into the hospitalization doxycycline was added.  On hospital day 3 repeat x-ray brought about concern for progressive infiltrates and therefore intravenous ceftriaxone was added.  On hospital day 4 CT imaging of the chest with contrast was performed revealing diffuse groundglass infiltrates and therefore on hospital day 5 pulmonology was consulted.     Pulmonology felt that the patient's complicated presentation may be secondary to a combination of asthma, bronchiolitis and mucous plugs.  They recommended addition of Pulmicort twice daily for 48 hours, Breo daily and close monitoring.  They recommended that after discharge the patient should undergo a repeat CT chest in 4 to 6 weeks.  Assessment & Plan:   Principal Problem:   Acute exacerbation of COPD with asthma (HCC) Active Problems:   Acute on chronic respiratory failure with hypoxia (HCC)   Nicotine dependence, cigarettes, uncomplicated   GERD without esophagitis   Mixed hyperlipidemia  Acute on chronic hypoxic  respiratory failure secondary to acute exacerbation of COPD with asthma: She uses 2 to 3 L of oxygen intermittently.  Patient did not improve despite of maximum therapy for COPD with steroids and antibiotics for 5 days and then CT chest was obtained which showed groundglass opacities, PCCM was consulted.  They suspect COPD, bronchiolitis or mucous plugging and suspect inflammatory/infectious cause and recommended repeating CT chest in 4 to 6 weeks and started on Pulmicort as well as Breo.  Patient states that she has improved somewhat.  Still has wheezes bilaterally.  She completed doxycycline course.  Appreciate PCCM seeing her.  Still on 3 L of oxygen.  Continue current therapies.  Upon further questioning, she tells me that she was smoking up until 2 weeks ago and her husband smokes all the time so she has passive exposure as well.  Her house is 68+-year-old, she has seen some mold.  I will check Fungitell.  She has been ruled out of COVID, flu, RSV, respiratory viral panel and procalcitonin is unremarkable as well.  Nicotine dependence/cigarettes: She says that she stopped 2 weeks ago.  Counseling provided to continue to stay away from smoking.  She verbalized understanding.  GERD without esophagitis: Continue Protonix.  Mixed hyperlipidemia: Statin.  DVT prophylaxis: enoxaparin (LOVENOX) injection 40 mg Start: 01/13/23 1245 SCDs Start: 01/13/23 1239   Code Status: Full Code  Family Communication:  None present at bedside.  Plan of care discussed with patient in length and he/she verbalized understanding and agreed with it.  Status is: Inpatient Remains inpatient appropriate because: Still symptomatic and hypoxic   Estimated body mass index is 27.21 kg/m as calculated from the following:   Height as of this encounter: 5\' 1"  (1.549  m).   Weight as of this encounter: 65.3 kg.    Nutritional Assessment: Body mass index is 27.21 kg/m.Marland Kitchen Seen by dietician.  I agree with the assessment and  plan as outlined below: Nutrition Status:        . Skin Assessment: I have examined the patient's skin and I agree with the wound assessment as performed by the wound care RN as outlined below:    Consultants:  PC CM  Procedures:  None  Antimicrobials:  Anti-infectives (From admission, onward)    Start     Dose/Rate Route Frequency Ordered Stop   01/16/23 1045  cefTRIAXone (ROCEPHIN) 2 g in sodium chloride 0.9 % 100 mL IVPB        2 g 200 mL/hr over 30 Minutes Intravenous Every 24 hours 01/16/23 0947     01/14/23 2230  doxycycline (VIBRA-TABS) tablet 100 mg        100 mg Oral Every 12 hours 01/14/23 2136 01/19/23 2159   01/13/23 1245  azithromycin (ZITHROMAX) 500 mg in sodium chloride 0.9 % 250 mL IVPB        500 mg 250 mL/hr over 60 Minutes Intravenous  Once 01/13/23 1230 01/13/23 1345         Subjective: Seen and examined.  She states that she is slightly improved.  No other complaint.  Objective: Vitals:   01/18/23 2250 01/18/23 2318 01/19/23 0523 01/19/23 0748  BP:  130/79 131/89   Pulse:  (!) 107 (!) 105   Resp:  16 16   Temp:  97.7 F (36.5 C) 97.8 F (36.6 C)   TempSrc:  Oral Oral   SpO2: 98% 96% 97% 94%  Weight:   65.3 kg   Height:   5\' 1"  (1.549 m)     Intake/Output Summary (Last 24 hours) at 01/19/2023 0910 Last data filed at 01/19/2023 0828 Gross per 24 hour  Intake 920 ml  Output --  Net 920 ml   Filed Weights   01/19/23 0523  Weight: 65.3 kg    Examination:  General exam: Appears calm and comfortable  Respiratory system: pan expiratory b/l wheezes. Respiratory effort normal. Cardiovascular system: S1 & S2 heard, RRR. No JVD, murmurs, rubs, gallops or clicks. No pedal edema. Gastrointestinal system: Abdomen is nondistended, soft and nontender. No organomegaly or masses felt. Normal bowel sounds heard. Central nervous system: Alert and oriented. No focal neurological deficits. Extremities: Symmetric 5 x 5 power. Skin: No rashes,  lesions or ulcers Psychiatry: Judgement and insight appear normal. Mood & affect appropriate.    Data Reviewed: I have personally reviewed following labs and imaging studies  CBC: Recent Labs  Lab 01/13/23 0931 01/14/23 0534 01/15/23 0544 01/16/23 0539 01/18/23 0559 01/19/23 0549  WBC 8.5 11.9* 12.2* 9.1 9.5 10.8*  NEUTROABS 6.2  --  10.5* 8.0* 8.0* 9.5*  HGB 11.4* 11.1* 11.2* 10.7* 11.1* 11.3*  HCT 35.6* 34.7* 37.7 35.8* 36.5 37.2  MCV 84.2 86.3 89.8 89.1 88.0 87.9  PLT 318 344 384 379 409* 404*   Basic Metabolic Panel: Recent Labs  Lab 01/14/23 0534 01/15/23 0544 01/16/23 0539 01/18/23 0559 01/19/23 0549  NA 139 138 136 141 139  K 4.3 4.6 4.2 4.3 4.5  CL 99 99 97* 102 98  CO2 27 27 27 31 29   GLUCOSE 149* 126* 168* 141* 137*  BUN 12 14 15 15 15   CREATININE 0.43* 0.71 0.67 0.58 0.61  CALCIUM 9.3 9.2 8.8* 9.2 9.1  MG  --  2.3 2.2 2.2  2.2  PHOS  --   --   --  4.5 4.6   GFR: Estimated Creatinine Clearance: 72.8 mL/min (by C-G formula based on SCr of 0.61 mg/dL). Liver Function Tests: Recent Labs  Lab 01/15/23 0544 01/16/23 0539 01/18/23 0559 01/19/23 0549  AST 18 19 12* 17  ALT 26 27 21 22   ALKPHOS 100 103 96 103  BILITOT 0.4 0.2* 0.5 0.1*  PROT 6.8 6.4* 6.6 6.4*  ALBUMIN 3.0* 2.9* 2.8* 2.9*   No results for input(s): "LIPASE", "AMYLASE" in the last 168 hours. No results for input(s): "AMMONIA" in the last 168 hours. Coagulation Profile: No results for input(s): "INR", "PROTIME" in the last 168 hours. Cardiac Enzymes: No results for input(s): "CKTOTAL", "CKMB", "CKMBINDEX", "TROPONINI" in the last 168 hours. BNP (last 3 results) No results for input(s): "PROBNP" in the last 8760 hours. HbA1C: No results for input(s): "HGBA1C" in the last 72 hours. CBG: No results for input(s): "GLUCAP" in the last 168 hours. Lipid Profile: No results for input(s): "CHOL", "HDL", "LDLCALC", "TRIG", "CHOLHDL", "LDLDIRECT" in the last 72 hours. Thyroid Function  Tests: No results for input(s): "TSH", "T4TOTAL", "FREET4", "T3FREE", "THYROIDAB" in the last 72 hours. Anemia Panel: No results for input(s): "VITAMINB12", "FOLATE", "FERRITIN", "TIBC", "IRON", "RETICCTPCT" in the last 72 hours. Sepsis Labs: Recent Labs  Lab 01/15/23 0544  PROCALCITON <0.10    Recent Results (from the past 240 hour(s))  SARS Coronavirus 2 by RT PCR (hospital order, performed in [facility] hospital lab) *cepheid single result test* Anterior Nasal Swab     Status: None   Collection Time: 01/13/23  3:22 AM   Specimen: Anterior Nasal Swab  Result Value Ref Range Status   SARS Coronavirus 2 by RT PCR NEGATIVE NEGATIVE Final    Comment: (NOTE) SARS-CoV-2 target nucleic acids are NOT DETECTED.  The SARS-CoV-2 RNA is generally detectable in upper and lower respiratory specimens during the acute phase of infection. The lowest concentration of SARS-CoV-2 viral copies this assay can detect is 250 copies / mL. A negative result does not preclude SARS-CoV-2 infection and should not be used as the sole basis for treatment or other patient management decisions.  A negative result may occur with improper specimen collection / handling, submission of specimen other than nasopharyngeal swab, presence of viral mutation(s) within the areas targeted by this assay, and inadequate number of viral copies (<250 copies / mL). A negative result must be combined with clinical observations, patient history, and epidemiological information.  Fact Sheet for Patients:   RoadLapTop.co.za  Fact Sheet for Healthcare Providers: http://kim-miller.com/  This test is not yet approved or  cleared by the [place] FDA and has been authorized for detection and/or diagnosis of SARS-CoV-2 by FDA under an Emergency Use Authorization (EUA).  This EUA will remain in effect (meaning this test can be used) for the duration of the COVID-19 declaration under  Section 564(b)(1) of the Act, 21 U.S.C. section 360bbb-3(b)(1), unless the authorization is terminated or revoked sooner.  Performed at [facility], [address], [place], [place] [address]   Respiratory (~20 pathogens) panel by PCR     Status: None   Collection Time: 01/17/23  1:12 PM   Specimen: Nasopharyngeal Swab; Respiratory  Result Value Ref Range Status   Adenovirus NOT DETECTED NOT DETECTED Final   Coronavirus 229E NOT DETECTED NOT DETECTED Final    Comment: (NOTE) The Coronavirus on the Respiratory Panel, DOES NOT test for the novel  Coronavirus (2019 nCoV)  Coronavirus HKU1 NOT DETECTED NOT DETECTED Final   Coronavirus NL63 NOT DETECTED NOT DETECTED Final   Coronavirus OC43 NOT DETECTED NOT DETECTED Final   Metapneumovirus NOT DETECTED NOT DETECTED Final   Rhinovirus / Enterovirus NOT DETECTED NOT DETECTED Final   Influenza A NOT DETECTED NOT DETECTED Final   Influenza B NOT DETECTED NOT DETECTED Final   Parainfluenza Virus 1 NOT DETECTED NOT DETECTED Final   Parainfluenza Virus 2 NOT DETECTED NOT DETECTED Final   Parainfluenza Virus 3 NOT DETECTED NOT DETECTED Final   Parainfluenza Virus 4 NOT DETECTED NOT DETECTED Final   Respiratory Syncytial Virus NOT DETECTED NOT DETECTED Final   Bordetella pertussis NOT DETECTED NOT DETECTED Final   Bordetella Parapertussis NOT DETECTED NOT DETECTED Final   Chlamydophila pneumoniae NOT DETECTED NOT DETECTED Final   Mycoplasma pneumoniae NOT DETECTED NOT DETECTED Final    Comment: Performed at [facility], [address] [place], [place] [address]     Radiology Studies: CT CHEST W CONTRAST  Result Date: 01/17/2023 CLINICAL DATA:  Persisting hypoxic respiratory failure in a patient with COPD and pneumonia despite appropriate treatment EXAM: CT CHEST WITH CONTRAST TECHNIQUE: Multidetector CT imaging of the chest was performed during intravenous contrast administration. RADIATION DOSE REDUCTION: This  exam was performed according to the departmental dose-optimization program which includes automated exposure control, adjustment of the mA and/or kV according to patient size and/or use of iterative reconstruction technique. CONTRAST:  OMNIPAQUE IOHEXOL 300 MG/ML  SOLN COMPARISON:  Chest radiograph yesterday FINDINGS: Cardiovascular: The heart is normal in size. No pericardial effusion. Mild aortic atherosclerosis without aneurysm. No central pulmonary embolus on this exam not tailored to pulmonary artery assessment. Mediastinum/Nodes: Scattered small mediastinal lymph nodes, none of which are enlarged by size criteria. Borderline high right hilar node at 9 mm. Moderate-sized hiatal hernia with mild wall thickening of the distal esophagus. Lungs/Pleura: Multifocal geographic ground-glass and linear opacities within both lungs. Occasional areas of consolidation. Central predominant distribution but no apical basilar gradient. There is moderate central bronchial thickening. Occasional mucoid impaction in both lower lobes. No dominant pulmonary mass. No pleural effusion. Upper Abdomen: No acute findings.  Cholecystectomy. Musculoskeletal: There are no acute or suspicious osseous abnormalities. No chest wall soft tissue abnormalities. IMPRESSION: 1. Multifocal geographic ground-glass and linear opacities within both lungs, with a central predominant distribution. Findings are nonspecific, and may be infectious or inflammatory. The possibility of interstitial pneumonia or other atypical pneumonias are considered. 2. Moderate central bronchial thickening with occasional mucoid impaction in both lower lobes. 3. Moderate-sized hiatal hernia with mild wall thickening of the distal esophagus, can be seen with reflux or esophagitis. Aortic Atherosclerosis (ICD10-I70.0). Electronically Signed   By: Narda Rutherford M.D.   On: 01/17/2023 16:44    Scheduled Meds:  budesonide (PULMICORT) nebulizer solution  0.25 mg  Nebulization BID   dicyclomine  20 mg Oral TID AC   doxycycline  100 mg Oral Q12H   enoxaparin (LOVENOX) injection  40 mg Subcutaneous Q24H   FLUoxetine  80 mg Oral Daily   fluticasone  1 spray Each Nare Daily   fluticasone furoate-vilanterol  1 puff Inhalation Daily   ipratropium-albuterol  3 mL Nebulization QID   loratadine  10 mg Oral Daily   LORazepam  1 mg Oral 2 times per day   methylPREDNISolone (SOLU-MEDROL) injection  60 mg Intravenous Q12H   mirtazapine  45 mg Oral QHS   pantoprazole  40 mg Oral QAC breakfast   risperiDONE  1 mg Oral BID   rosuvastatin  10 mg Oral Daily   Continuous Infusions:  cefTRIAXone (ROCEPHIN)  IV Stopped (01/18/23 1040)     LOS: 6 days   Hughie Closs, MD [facility]  01/19/2023, 9:10 AM   *Please note that this is a verbal dictation therefore any spelling or grammatical errors are due to the "Dragon Medical One" system interpretation.  Please page via Amion and do not message via secure chat for urgent patient care matters. Secure chat can be used for non urgent patient care matters.  How to contact the [facility] Attending or Consulting provider 7A - 7P or covering provider during after hours 7P -7A, for this patient?  Check the care team in [facility] and look for a) attending/consulting TRH provider listed and b) the [facility] team listed. Page or secure chat 7A-7P. Log into [email or web address] and use [place]'s universal password to access. If you do not have the password, please contact the hospital operator. Locate the [facility] provider you are looking for under [facility] and page to a number that you can be directly reached. If you still have difficulty reaching the provider, please page the [facility] (Director on Call) for the Hospitalists listed on amion for assistance.

## 2023-01-19 NOTE — Progress Notes (Signed)
Chaplain engaged in an initial visit with Nicole Cohen. Nicole Cohen voiced that she is feeling bored today. Chaplain offered to bring coloring aids or puzzles if needed. Nicole Cohen noted that she has been hospitalized for the last week and she is ready to go home. Chaplain uplifted and honored her feelings. Nicole Cohen's family is coming to visit today and she stated how she is looking forward to that. Nicole Cohen has three children and her husband. She feels well supported.   Nicole Cohen noted that the changes in her health have been really hard on her. She wasn't on oxygen previously and now recognizes that she may have to be on it consistently.   Chaplain offered listening, presence, and support.    01/19/23 1500  Spiritual Encounters  Type of Visit Initial  Reason for visit Routine spiritual support  Spiritual Framework  Presenting Themes Impactful experiences and emotions;Community and relationships;Significant life change  Interventions  Spiritual Care Interventions Made Reflective listening;Compassionate presence;Established relationship of care and support;Narrative/life review  Intervention Outcomes  Outcomes Awareness of support

## 2023-01-19 NOTE — Progress Notes (Signed)
PROGRESS NOTE   Nicole Cohen  EAV:[id number] DOB: 02/18/1971 DOA: 01/13/2023 PCP: [facility]   Date of Service: the patient was seen and examined on 01/18/2023  Brief Narrative:  52 year old female with past medical history of asthma and COPD, gastroesophageal reflux disease who presented to [facility] emergency department with a 3-day history of increasing shortness of breath and cough.   Upon evaluation, patient was found to be in acute hypoxic respiratory failure requiring initiation of supplemental oxygen.  Felt to be suffering from an exacerbation of her asthma and COPD.  Patient was initiated on intravenous Solu-Medrol as well as bronchodilator therapy and the hospitalist group was then called to assess the patient for admission to the hospital.  Patient experienced extremely slow clinical recovery throughout the hospitalization.  Patient was treated with a combination of high-dose steroids and aggressive bronchodilator therapy initially.  Several days into the hospitalization doxycycline was added.  On hospital day 3 repeat x-ray brought about concern for progressive infiltrates and therefore intravenous ceftriaxone was added.  On hospital day 4 CT imaging of the chest with contrast was performed revealing diffuse groundglass infiltrates and therefore on hospital day 5 pulmonology was consulted.    Pulmonology felt that the patient's complicated presentation may be secondary to a combination of asthma, bronchiolitis and mucous plugs.  They recommended addition of Pulmicort twice daily for 48 hours, Breo daily and close monitoring.  They recommended that after discharge the patient should undergo a repeat CT chest in 4 to 6 weeks.   Assessment and Plan: * Acute exacerbation of COPD with asthma [facility]) Patient continues to experience substantial symptoms of shortness of breath and wheezing  CT chest performed 10/19 revealing diffuse groundglass infiltrates.   Obtaining pulmonology consultation, their input is appreciated Pulmonology recommending Pulmicort twice daily in addition to inhaled Breo daily.  They recommend that once patient is discharged a repeat CT scan should be performed in 4 to 6 weeks. Continuing aggressive bronchodilator therapy Continuing Solumedrol to 60mg  IV Q12hrs Continuing doxycycline twice daily, day 5 Continuing ceftriaxone, day 3. BNP unremarkable   Acute on chronic respiratory failure with hypoxia (HCC) Thought to be secondary to COPD/asthma exacerbation Treating as above Patient continuing to experience increased oxygen requirement compared to baseline. Continuing to slowly wean as able  Nicotine dependence, cigarettes, uncomplicated Counseling on cessation daily  GERD without esophagitis Continue daily Protonix  Mixed hyperlipidemia Remote history of hyperlipidemia although patient did not have a statin on her home medication reconciliation. Fasting lipid panel revealing LDL of 150 rosuvastatin 10 mg daily.      Subjective:  Patient continues to complain of shortness of breath, moderate to severe in intensity with associated wheezing with minimal change compared to yesterday.  Occasional cough.  Patient denies chest pain.  Overall patient states she feels slightly better than yesterday.  Physical Exam:  Vitals:   01/18/23 1926 01/18/23 1929    BP:      Pulse:      Resp:      Temp:      TempSrc:      SpO2: 97% 97%       Constitutional: Awake alert and oriented x3, no associated distress.   Skin: no rashes, no lesions, good skin turgor noted. Eyes: Pupils are equally reactive to light.  No evidence of scleral icterus or conjunctival pallor.  ENMT: Moist mucous membranes noted.  Posterior pharynx clear of any exudate or lesions.   Respiratory: Coarse expiratory wheezing in all  fields with prolonged expiratory phase.  Minimal rales bilaterally.. Normal respiratory effort. No accessory muscle use.   Cardiovascular: Regular rate and rhythm, no murmurs / rubs / gallops. No extremity edema. 2+ pedal pulses. No carotid bruits.  Abdomen: Abdomen is soft and nontender.  No evidence of intra-abdominal masses.  Positive bowel sounds noted in all quadrants.   Musculoskeletal: No joint deformity upper and lower extremities. Good ROM, no contractures. Normal muscle tone.    Data Reviewed:  I have personally reviewed and interpreted labs, imaging.  Significant findings are   CBC: Recent Labs  Lab 01/13/23 0931 01/14/23 0534 01/15/23 0544 01/16/23 0539 01/18/23 0559  WBC 8.5 11.9* 12.2* 9.1 9.5  NEUTROABS 6.2  --  10.5* 8.0* 8.0*  HGB 11.4* 11.1* 11.2* 10.7* 11.1*  HCT 35.6* 34.7* 37.7 35.8* 36.5  MCV 84.2 86.3 89.8 89.1 88.0  PLT 318 344 384 379 409*   Basic Metabolic Panel: Recent Labs  Lab 01/13/23 0931 01/14/23 0534 01/15/23 0544 01/16/23 0539 01/18/23 0559  NA 134* 139 138 136 141  K 3.2* 4.3 4.6 4.2 4.3  CL 97* 99 99 97* 102  CO2 25 27 27 27 31   GLUCOSE 117* 149* 126* 168* 141*  BUN 12 12 14 15 15   CREATININE 0.67 0.43* 0.71 0.67 0.58  CALCIUM 8.9 9.3 9.2 8.8* 9.2  MG  --   --  2.3 2.2 2.2  PHOS  --   --   --   --  4.5   GFR: CrCl cannot be calculated (Unknown ideal weight.). Liver Function Tests: Recent Labs  Lab 01/15/23 0544 01/16/23 0539 01/18/23 0559  AST 18 19 12*  ALT 26 27 21   ALKPHOS 100 103 96  BILITOT 0.4 0.2* 0.5  PROT 6.8 6.4* 6.6  ALBUMIN 3.0* 2.9* 2.8*      Code Status:  Full code.  Code status decision has been confirmed with: patient    Severity of Illness:  The appropriate patient status for this patient is INPATIENT. Inpatient status is judged to be reasonable and necessary in order to provide the required intensity of service to ensure the patient's safety. The patient's presenting symptoms, physical exam findings, and initial radiographic and laboratory data in the context of their chronic comorbidities is felt to place them at  high risk for further clinical deterioration. Furthermore, it is not anticipated that the patient will be medically stable for discharge from the hospital within 2 midnights of admission.   * I certify that at the point of admission it is my clinical judgment that the patient will require inpatient hospital care spanning beyond 2 midnights from the point of admission due to high intensity of service, high risk for further deterioration and high frequency of surveillance required.*  Time spent:  50 minutes  Author:  Marinda Elk MD  01/18/2023

## 2023-01-20 DIAGNOSIS — R0902 Hypoxemia: Secondary | ICD-10-CM

## 2023-01-20 DIAGNOSIS — J441 Chronic obstructive pulmonary disease with (acute) exacerbation: Secondary | ICD-10-CM | POA: Diagnosis not present

## 2023-01-20 MED ORDER — UMECLIDINIUM BROMIDE 62.5 MCG/ACT IN AEPB
1.0000 | INHALATION_SPRAY | Freq: Every day | RESPIRATORY_TRACT | Status: DC
  Administered 2023-01-20 – 2023-01-21 (×2): 1 via RESPIRATORY_TRACT
  Filled 2023-01-20: qty 7

## 2023-01-20 MED ORDER — LORAZEPAM 1 MG PO TABS: 1.0000 mg | ORAL_TABLET | Freq: Every evening | ORAL | Status: DC | PRN

## 2023-01-20 MED ORDER — METHYLPREDNISOLONE SODIUM SUCC 125 MG IJ SOLR
60.0000 mg | Freq: Every day | INTRAMUSCULAR | Status: DC
  Administered 2023-01-21: 60 mg via INTRAVENOUS
  Filled 2023-01-20: qty 2

## 2023-01-20 NOTE — Progress Notes (Addendum)
NAME:  Nicole Cohen, MRN:  [id number], DOB:  [birth year], LOS: 7 ADMISSION DATE:  01/13/2023, CONSULTATION DATE:  01/18/23 REFERRING MD:  Shauna Hugh, MD, CHIEF COMPLAINT:  Wheezing   History of Present Illness:   52 year old female active smoker with COPD/asthma, anxiety and reflux admitted for COPD exacerbation on [facility] service. Initially presented with 10 day history of shortness of breath and cough that worsened in the last 3 days. Treated outpatient with antibiotics and IM steroids without improvement. Started on oxygen for hypoxemia to 85% and treated with steroids, doxycycline and nebulizers for COPD exacerbation. D-dimer and procal neg. Patient admitted on 10/15 however continues to have shortness of breath and wheezing despite medical management.  CT chest with multifocal GGO opacities with central distribution., moderate hiatal hernia, central bronchial thickening with mucoid impaction. RVP neg. Solumedrol was increased to 120 mg total daily. PCCM consulted for recommendations.  She is on Advair at home Diskus 250-50 that she recently started. Continues to have productive cough associated with wheezing  Pertinent  Medical History  COPD/asthma, HLD and reflux  Significant Hospital Events: Including procedures, antibiotic start and stop dates in addition to other pertinent events   10/15 Admitted 10/20 PCCM consulted  Interim History / Subjective:   Feeling better today. Remains on 3L.   Objective   Blood pressure 109/72, pulse 89, temperature 98 F (36.7 C), temperature source Oral, resp. rate 16, height 5\' 1"  (1.549 m), weight 65.3 kg, SpO2 97%.        Intake/Output Summary (Last 24 hours) at 01/20/2023 0945 Last data filed at 01/20/2023 0600 Gross per 24 hour  Intake 840 ml  Output --  Net 840 ml   Filed Weights   01/19/23 0523  Weight: 65.3 kg    Physical Exam: Blood pressure 131/89, pulse (!) 105, temperature 97.8 F (36.6 C), temperature source Oral, resp.  rate 16, height 5\' 1"  (1.549 m), weight 65.3 kg, SpO2 94%. Gen:      Middle aged female of normal body habitus in NAD HEENT:   /AT, PERRL, no JVD Lungs:    distant wheeze L > R CV:         RRR, no MRG Abd:      Soft, NT, ND Ext:    No after deformity or edema.  Skin:      Warm and dry; no rash Neuro:   Alert, oriented, non-focal     Resolved Hospital Problem list   N/A  Assessment & Plan:   COPD acute exacerbation - No PFTs on file Acute hypoxemic respiratory failure secondary to COPD flareac Wheezing may be secondary to asthma, bronchiolitis, mucous plugs in setting of probable viral illness - Continue Breo, add incruse  - PRN albuterol - Solu-Medrol at current dose of 60 mg twice daily > wean to 60 mg daily today - 5 day doxy course ended 10/21 - Remains on ceftriaxone since 10/18. PCT negative. Can likely DC per primary - Repeat CXR - Will need follow-up CT 4 to 6 weeks.  She follows with Dr. Tomasita Morrow at [facility] for pulmonary.  Reflux  -Protonix daily  Anxiety -Home lorazepam 1 mg TID -Fluoxetine 40 mg daily  Best Practice (right click and "Reselect all SmartList Selections" daily)   Diet/type: Regular consistency (see orders) DVT prophylaxis: LMWH GI prophylaxis: PPI Lines: N/A Foley:  N/A Code Status:  full code Last date of multidisciplinary goals of care discussion [Per primary]  Signature:    Joneen Roach,  AGACNP-BC [facility]  See Amion for personal pager PCCM on call pager [phone] until 7pm. Please call Elink 7p-7a. [phone]  01/20/2023 10:24 AM

## 2023-01-20 NOTE — Plan of Care (Signed)
  Problem: Education: Goal: Knowledge of General Education information will improve Description Including pain rating scale, medication(s)/side effects and non-pharmacologic comfort measures Outcome: Progressing   Problem: Health Behavior/Discharge Planning: Goal: Ability to manage health-related needs will improve Outcome: Progressing   

## 2023-01-20 NOTE — Plan of Care (Signed)
  Problem: Education: Goal: Knowledge of General Education information will improve Description Including pain rating scale, medication(s)/side effects and non-pharmacologic comfort measures Outcome: Progressing   Problem: Education: Goal: Knowledge of General Education information will improve Description Including pain rating scale, medication(s)/side effects and non-pharmacologic comfort measures Outcome: Progressing   

## 2023-01-20 NOTE — Progress Notes (Signed)
PROGRESS NOTE    MERSAYDES OBST  WNU:[id number] DOB: [birth year] DOA: 01/13/2023 PCP: [facility]   Brief Narrative:  52 year old female with past medical history of asthma and COPD, gastroesophageal reflux disease who presented to [facility] emergency department with a 3-day history of increasing shortness of breath and cough.    Upon evaluation, patient was found to be in acute hypoxic respiratory failure requiring initiation of supplemental oxygen.  Felt to be suffering from an exacerbation of her asthma and COPD.  Patient was initiated on intravenous Solu-Medrol as well as bronchodilator therapy and the hospitalist group was then called to assess the patient for admission to the hospital.   Patient experienced extremely slow clinical recovery throughout the hospitalization.  Patient was treated with a combination of high-dose steroids and aggressive bronchodilator therapy initially.  Several days into the hospitalization doxycycline was added.  On hospital day 3 repeat x-ray brought about concern for progressive infiltrates and therefore intravenous ceftriaxone was added.  On hospital day 4 CT imaging of the chest with contrast was performed revealing diffuse groundglass infiltrates and therefore on hospital day 5 pulmonology was consulted.     Pulmonology felt that the patient's complicated presentation may be secondary to a combination of asthma, bronchiolitis and mucous plugs.  They recommended addition of Pulmicort twice daily for 48 hours, Breo daily and close monitoring.  They recommended that after discharge the patient should undergo a repeat CT chest in 4 to 6 weeks.  Assessment & Plan:   Principal Problem:   Acute exacerbation of COPD with asthma (HCC) Active Problems:   Acute on chronic respiratory failure with hypoxia (HCC)   Nicotine dependence, cigarettes, uncomplicated   GERD without esophagitis   Mixed hyperlipidemia   Hypoxia  Acute on chronic  hypoxic respiratory failure secondary to acute exacerbation of COPD with asthma: She uses 2 to 3 L of oxygen intermittently.  Patient did not improve despite of maximum therapy for COPD with steroids and antibiotics for 5 days and then CT chest was obtained which showed groundglass opacities, PCCM was consulted.  They suspect COPD, bronchiolitis or mucous plugging and suspect inflammatory/infectious cause and recommended repeating CT chest in 4 to 6 weeks and started on Pulmicort as well as Breo.  Patient improving, still has inspiratory and expiratory wheezes but improved compared to yesterday.  On 3 L of oxygen but saturating 97%.  I have ordered to wean her down to room air or as low oxygen as we can.  Secure chat sent to the nurse.  Continue current therapies. she was smoking up until 2 weeks ago and her husband smokes all the time so she has passive exposure as well.  Her house is 71+-year-old, she has seen some mold.  I ordered Fungitell.  She has been ruled out of COVID, flu, RSV, respiratory viral panel and procalcitonin is unremarkable as well.  PCCM saw her and they have adjusted her Solu-Medrol dose to once daily 60 mg IV starting tomorrow.   Nicotine dependence/cigarettes: She says that she stopped 2 weeks ago.  Counseling provided to continue to stay away from smoking.  She verbalized understanding.  GERD without esophagitis: Continue Protonix.  Mixed hyperlipidemia: Statin.  DVT prophylaxis: enoxaparin (LOVENOX) injection 40 mg Start: 01/13/23 1245 SCDs Start: 01/13/23 1239   Code Status: Full Code  Family Communication:  None present at bedside.  Plan of care discussed with patient in length and he/she verbalized understanding and agreed with it.  Status is: Inpatient  Remains inpatient appropriate because: Still symptomatic and hypoxic   Estimated body mass index is 27.21 kg/m as calculated from the following:   Height as of this encounter: 5\' 1"  (1.549 m).   Weight as of this  encounter: 65.3 kg.    Nutritional Assessment: Body mass index is 27.21 kg/m.Marland Kitchen Seen by dietician.  I agree with the assessment and plan as outlined below: Nutrition Status:        . Skin Assessment: I have examined the patient's skin and I agree with the wound assessment as performed by the wound care RN as outlined below:    Consultants:  PC CM  Procedures:  None  Antimicrobials:  Anti-infectives (From admission, onward)    Start     Dose/Rate Route Frequency Ordered Stop   01/16/23 1045  cefTRIAXone (ROCEPHIN) 2 g in sodium chloride 0.9 % 100 mL IVPB        2 g 200 mL/hr over 30 Minutes Intravenous Every 24 hours 01/16/23 0947     01/14/23 2230  doxycycline (VIBRA-TABS) tablet 100 mg        100 mg Oral Every 12 hours 01/14/23 2136 01/19/23 2159   01/13/23 1245  azithromycin (ZITHROMAX) 500 mg in sodium chloride 0.9 % 250 mL IVPB        500 mg 250 mL/hr over 60 Minutes Intravenous  Once 01/13/23 1230 01/13/23 1345         Subjective: Patient seen and examined.  She has improved somewhat further compared to yesterday.  Objective: Vitals:   01/19/23 1946 01/19/23 2010 01/20/23 0453 01/20/23 0737  BP:  118/78 109/72   Pulse:  (!) 108 89   Resp:  18 16   Temp:  97.8 F (36.6 C) 98 F (36.7 C)   TempSrc:  Oral Oral   SpO2: 95% 96% 97% 97%  Weight:      Height:        Intake/Output Summary (Last 24 hours) at 01/20/2023 1257 Last data filed at 01/20/2023 0900 Gross per 24 hour  Intake 960 ml  Output --  Net 960 ml   Filed Weights   01/19/23 0523  Weight: 65.3 kg    Examination:  General exam: Appears calm and comfortable  Respiratory system: Scattered inspiratory and expiratory wheezes bilaterally. Respiratory effort normal. Cardiovascular system: S1 & S2 heard, RRR. No JVD, murmurs, rubs, gallops or clicks. No pedal edema. Gastrointestinal system: Abdomen is nondistended, soft and nontender. No organomegaly or masses felt. Normal bowel sounds  heard. Central nervous system: Alert and oriented. No focal neurological deficits. Extremities: Symmetric 5 x 5 power. Skin: No rashes, lesions or ulcers.  Psychiatry: Judgement and insight appear normal. Mood & affect appropriate.   Data Reviewed: I have personally reviewed following labs and imaging studies  CBC: Recent Labs  Lab 01/14/23 0534 01/15/23 0544 01/16/23 0539 01/18/23 0559 01/19/23 0549  WBC 11.9* 12.2* 9.1 9.5 10.8*  NEUTROABS  --  10.5* 8.0* 8.0* 9.5*  HGB 11.1* 11.2* 10.7* 11.1* 11.3*  HCT 34.7* 37.7 35.8* 36.5 37.2  MCV 86.3 89.8 89.1 88.0 87.9  PLT 344 384 379 409* 404*   Basic Metabolic Panel: Recent Labs  Lab 01/14/23 0534 01/15/23 0544 01/16/23 0539 01/18/23 0559 01/19/23 0549  NA 139 138 136 141 139  K 4.3 4.6 4.2 4.3 4.5  CL 99 99 97* 102 98  CO2 27 27 27 31 29   GLUCOSE 149* 126* 168* 141* 137*  BUN 12 14 15 15 15   CREATININE 0.43*  0.71 0.67 0.58 0.61  CALCIUM 9.3 9.2 8.8* 9.2 9.1  MG  --  2.3 2.2 2.2 2.2  PHOS  --   --   --  4.5 4.6   GFR: Estimated Creatinine Clearance: 72.8 mL/min (by C-G formula based on SCr of 0.61 mg/dL). Liver Function Tests: Recent Labs  Lab 01/15/23 0544 01/16/23 0539 01/18/23 0559 01/19/23 0549  AST 18 19 12* 17  ALT 26 27 21 22   ALKPHOS 100 103 96 103  BILITOT 0.4 0.2* 0.5 0.1*  PROT 6.8 6.4* 6.6 6.4*  ALBUMIN 3.0* 2.9* 2.8* 2.9*   No results for input(s): "LIPASE", "AMYLASE" in the last 168 hours. No results for input(s): "AMMONIA" in the last 168 hours. Coagulation Profile: No results for input(s): "INR", "PROTIME" in the last 168 hours. Cardiac Enzymes: No results for input(s): "CKTOTAL", "CKMB", "CKMBINDEX", "TROPONINI" in the last 168 hours. BNP (last 3 results) No results for input(s): "PROBNP" in the last 8760 hours. HbA1C: No results for input(s): "HGBA1C" in the last 72 hours. CBG: No results for input(s): "GLUCAP" in the last 168 hours. Lipid Profile: No results for input(s): "CHOL",  "HDL", "LDLCALC", "TRIG", "CHOLHDL", "LDLDIRECT" in the last 72 hours. Thyroid Function Tests: No results for input(s): "TSH", "T4TOTAL", "FREET4", "T3FREE", "THYROIDAB" in the last 72 hours. Anemia Panel: No results for input(s): "VITAMINB12", "FOLATE", "FERRITIN", "TIBC", "IRON", "RETICCTPCT" in the last 72 hours. Sepsis Labs: Recent Labs  Lab 01/15/23 0544  PROCALCITON <0.10    Recent Results (from the past 240 hour(s))  SARS Coronavirus 2 by RT PCR (hospital order, performed in [facility] hospital lab) *cepheid single result test* Anterior Nasal Swab     Status: None   Collection Time: 01/13/23  3:22 AM   Specimen: Anterior Nasal Swab  Result Value Ref Range Status   SARS Coronavirus 2 by RT PCR NEGATIVE NEGATIVE Final    Comment: (NOTE) SARS-CoV-2 target nucleic acids are NOT DETECTED.  The SARS-CoV-2 RNA is generally detectable in upper and lower respiratory specimens during the acute phase of infection. The lowest concentration of SARS-CoV-2 viral copies this assay can detect is 250 copies / mL. A negative result does not preclude SARS-CoV-2 infection and should not be used as the sole basis for treatment or other patient management decisions.  A negative result may occur with improper specimen collection / handling, submission of specimen other than nasopharyngeal swab, presence of viral mutation(s) within the areas targeted by this assay, and inadequate number of viral copies (<250 copies / mL). A negative result must be combined with clinical observations, patient history, and epidemiological information.  Fact Sheet for Patients:   RoadLapTop.co.za  Fact Sheet for Healthcare Providers: http://kim-miller.com/  This test is not yet approved or  cleared by the [place] FDA and has been authorized for detection and/or diagnosis of SARS-CoV-2 by FDA under an Emergency Use Authorization (EUA).  This EUA will remain in  effect (meaning this test can be used) for the duration of the COVID-19 declaration under Section 564(b)(1) of the Act, 21 U.S.C. section 360bbb-3(b)(1), unless the authorization is terminated or revoked sooner.  Performed at [facility], [address], [place], [place] [address]   Respiratory (~20 pathogens) panel by PCR     Status: None   Collection Time: 01/17/23  1:12 PM   Specimen: Nasopharyngeal Swab; Respiratory  Result Value Ref Range Status   Adenovirus NOT DETECTED NOT DETECTED Final   Coronavirus 229E NOT DETECTED NOT DETECTED Final  Comment: (NOTE) The Coronavirus on the Respiratory Panel, DOES NOT test for the novel  Coronavirus (2019 nCoV)    Coronavirus HKU1 NOT DETECTED NOT DETECTED Final   Coronavirus NL63 NOT DETECTED NOT DETECTED Final   Coronavirus OC43 NOT DETECTED NOT DETECTED Final   Metapneumovirus NOT DETECTED NOT DETECTED Final   Rhinovirus / Enterovirus NOT DETECTED NOT DETECTED Final   Influenza A NOT DETECTED NOT DETECTED Final   Influenza B NOT DETECTED NOT DETECTED Final   Parainfluenza Virus 1 NOT DETECTED NOT DETECTED Final   Parainfluenza Virus 2 NOT DETECTED NOT DETECTED Final   Parainfluenza Virus 3 NOT DETECTED NOT DETECTED Final   Parainfluenza Virus 4 NOT DETECTED NOT DETECTED Final   Respiratory Syncytial Virus NOT DETECTED NOT DETECTED Final   Bordetella pertussis NOT DETECTED NOT DETECTED Final   Bordetella Parapertussis NOT DETECTED NOT DETECTED Final   Chlamydophila pneumoniae NOT DETECTED NOT DETECTED Final   Mycoplasma pneumoniae NOT DETECTED NOT DETECTED Final    Comment: Performed at [facility], [address] [place], [place] [address]     Radiology Studies: No results found.  Scheduled Meds:  dicyclomine  20 mg Oral TID AC   enoxaparin (LOVENOX) injection  40 mg Subcutaneous Q24H   FLUoxetine  80 mg Oral Daily   fluticasone  1 spray Each Nare Daily   fluticasone furoate-vilanterol  1 puff  Inhalation Daily   loratadine  10 mg Oral Daily   LORazepam  1 mg Oral 2 times per day   [START ON 01/21/2023] methylPREDNISolone (SOLU-MEDROL) injection  60 mg Intravenous Daily   mirtazapine  45 mg Oral QHS   pantoprazole  40 mg Oral QAC breakfast   risperiDONE  1 mg Oral BID   rosuvastatin  10 mg Oral Daily   umeclidinium bromide  1 puff Inhalation Daily   Continuous Infusions:  cefTRIAXone (ROCEPHIN)  IV 2 g (01/20/23 0952)     LOS: 7 days   Hughie Closs, MD [facility]  01/20/2023, 12:57 PM   *Please note that this is a verbal dictation therefore any spelling or grammatical errors are due to the "Dragon Medical One" system interpretation.  Please page via Amion and do not message via secure chat for urgent patient care matters. Secure chat can be used for non urgent patient care matters.  How to contact the [facility] Attending or Consulting provider 7A - 7P or covering provider during after hours 7P -7A, for this patient?  Check the care team in [facility] and look for a) attending/consulting TR[facility] provider listed and b) the [facility] team listed. Page or secure chat 7A-7P. Log into [email or web address] and use [place]'s universal password to access. If you do not have the password, please contact the hospital operator. Locate the [facility] provider you are looking for under [facility] and page to a number that you can be directly reached. If you still have difficulty reaching the provider, please page the [facility] (Director on Call) for the Hospitalists listed on amion for assistance.

## 2023-01-20 NOTE — Plan of Care (Signed)
  Problem: Education: Goal: Knowledge of General Education information will improve Description: Including pain rating scale, medication(s)/side effects and non-pharmacologic comfort measures Outcome: Progressing   Problem: Health Behavior/Discharge Planning: Goal: Ability to manage health-related needs will improve Outcome: Progressing   Problem: Clinical Measurements: Goal: Ability to maintain clinical measurements within normal limits will improve Outcome: Progressing Goal: Will remain free from infection Outcome: Progressing Goal: Diagnostic test results will improve Outcome: Progressing Goal: Respiratory complications will improve Outcome: Progressing Goal: Cardiovascular complication will be avoided Outcome: Progressing   Problem: Activity: Goal: Risk for activity intolerance will decrease Outcome: Progressing   Problem: Nutrition: Goal: Adequate nutrition will be maintained Outcome: Progressing   Problem: Coping: Goal: Level of anxiety will decrease Outcome: Progressing   Problem: Elimination: Goal: Will not experience complications related to bowel motility Outcome: Progressing   Problem: Pain Managment: Goal: General experience of comfort will improve Outcome: Progressing   Problem: Safety: Goal: Ability to remain free from injury will improve Outcome: Progressing   Problem: Skin Integrity: Goal: Risk for impaired skin integrity will decrease Outcome: Progressing   Problem: Education: Goal: Knowledge of disease or condition will improve Outcome: Progressing Goal: Knowledge of the prescribed therapeutic regimen will improve Outcome: Progressing Goal: Individualized Educational Video(s) Outcome: Progressing   Problem: Activity: Goal: Ability to tolerate increased activity will improve Outcome: Progressing Goal: Will verbalize the importance of balancing activity with adequate rest periods Outcome: Progressing   Problem: Respiratory: Goal: Ability  to maintain a clear airway will improve Outcome: Progressing Goal: Levels of oxygenation will improve Outcome: Progressing Goal: Ability to maintain adequate ventilation will improve Outcome: Progressing

## 2023-01-21 ENCOUNTER — Inpatient Hospital Stay (HOSPITAL_COMMUNITY): Payer: Medicaid Other

## 2023-01-21 DIAGNOSIS — J441 Chronic obstructive pulmonary disease with (acute) exacerbation: Secondary | ICD-10-CM | POA: Diagnosis not present

## 2023-01-21 LAB — FUNGITELL BETA-D-GLUCAN
Fungitell Value:: <31.25 pg/mL
Result Name:: NEGATIVE

## 2023-01-21 MED ORDER — UMECLIDINIUM BROMIDE 62.5 MCG/ACT IN AEPB: 1.0000 | INHALATION_SPRAY | Freq: Every day | RESPIRATORY_TRACT | 0 refills | Status: AC

## 2023-01-21 MED ORDER — PREDNISONE 10 MG PO TABS
ORAL_TABLET | ORAL | 0 refills | Status: AC
Start: 2023-01-21 — End: 2023-02-02

## 2023-01-21 NOTE — Progress Notes (Signed)
NAME:  Nicole Cohen, MRN:  [id number], DOB:  1970-06-15, LOS: 8 ADMISSION DATE:  01/13/2023, CONSULTATION DATE:  01/18/23 REFERRING MD:  Shauna Hugh, MD, CHIEF COMPLAINT:  Wheezing   History of Present Illness:   52 year old female active smoker with COPD/asthma, anxiety and reflux admitted for COPD exacerbation on [facility] service. Initially presented with 10 day history of shortness of breath and cough that worsened in the last 3 days. Treated outpatient with antibiotics and IM steroids without improvement. Started on oxygen for hypoxemia to 85% and treated with steroids, doxycycline and nebulizers for COPD exacerbation. D-dimer and procal neg. Patient admitted on 10/15 however continues to have shortness of breath and wheezing despite medical management.  CT chest with multifocal GGO opacities with central distribution., moderate hiatal hernia, central bronchial thickening with mucoid impaction. RVP neg. Solumedrol was increased to 120 mg total daily. PCCM consulted for recommendations.  She is on Advair at home Diskus 250-50 that she recently started. Continues to have productive cough associated with wheezing  Pertinent  Medical History  COPD/asthma, HLD and reflux  Significant Hospital Events: Including procedures, antibiotic start and stop dates in addition to other pertinent events   10/15 Admitted 10/20 PCCM consulted  Interim History / Subjective:  Remains about the same. Still having some dyspnea at times improved with neb tx.  Ambulated this morning and required 2L [place].   Objective   Blood pressure 123/80, pulse 89, temperature 98.3 F (36.8 C), temperature source Oral, resp. rate 18, height 5\' 1"  (1.549 m), weight 65.3 kg, SpO2 94%.        Intake/Output Summary (Last 24 hours) at 01/21/2023 1109 Last data filed at 01/21/2023 0500 Gross per 24 hour  Intake 240 ml  Output --  Net 240 ml   Filed Weights   01/19/23 0523  Weight: 65.3 kg   BP 123/80 (BP Location:  Left Arm)   Pulse 89   Temp 98.3 F (36.8 C) (Oral)   Resp 18   Ht 5\' 1"  (1.549 m)   Wt 65.3 kg   SpO2 94%   BMI 27.21 kg/m   Physical Exam:  Gen:      Middle aged female in NAD HEENT:   [place]/AT, PERRL, No JVD Lungs:    distant wheeze in the R base. Clear L lung. Some mild upper airway wheeze.  CV:         RRR, no MRG Abd:     Soft, NT, ND Ext:    No acute deformity or edema.  Neuro:   Alert, oriented, non-focal  Resolved Hospital Problem list   N/A  Assessment & Plan:   COPD acute exacerbation - No PFTs on file Acute hypoxemic respiratory failure secondary to COPD flare Abnormal CT chest: multiple areas of ground glass in bilateral lungs.  - Resume home advair at discharge. Add Incruse or other ICS. Medication administration instructions given to rinse mouth after use.  - PRN albuterol nebs > she has machine and medication at home.  - Pred taper 40mg  daily x 3 days, then 30mg  daily x 3 days, then 20mg  daily x 3 days, then 10mg  daily x 3 days then stop.  - She will follow up with pulmonologist at [facility]. I have asked her to request hospital follow up within 1 week and she will also need to be seen for repeat CT chest in 4-6 weeks to look for resolution of GGO.    Best Practice (right click and "Reselect  all SmartList Selections" daily)   Diet/type: Regular consistency (see orders) DVT prophylaxis: LMWH GI prophylaxis: PPI Lines: N/A Foley:  N/A Code Status:  full code Last date of multidisciplinary goals of care discussion [Per primary]  Signature:    Joneen Roach, AGACNP-BC [facility]  See Amion for personal pager PCCM on call pager [phone] until 7pm. Please call Elink 7p-7a. [phone]  01/21/2023 11:09 AM

## 2023-01-21 NOTE — Discharge Summary (Signed)
Physician Discharge Summary  Nicole Cohen XBJ:[id number] DOB: [birth year] DOA: 01/13/2023  PCP: [facility]  Admit date: 01/13/2023 Discharge date: 01/21/2023 30 Day Unplanned Readmission Risk Score    Flowsheet Row ED to Hosp-Admission (Current) from 01/13/2023 in [facility]  30 Day Unplanned Readmission Risk Score (%) 13.81 Filed at 01/21/2023 1200       This score is the patient's risk of an unplanned readmission within 30 days of being discharged (0 -100%). The score is based on dignosis, age, lab data, medications, orders, and past utilization.   Low:  0-14.9   Medium: 15-21.9   High: 22-29.9   Extreme: 30 and above          Admitted From: Home Disposition: Home  Recommendations for Outpatient Follow-up:  Follow up with PCP in 1-2 weeks Please obtain BMP/CBC in one week Follow-up with your primary pulmonologist in 4 weeks to repeat CT chest. Please follow up with your PCP on the following pending results: Unresulted Labs (From admission, onward)     Start     Ordered   01/19/23 0910  Fungitell Beta-D-Glucan  Once,   R       Question:  Specimen collection method  Answer:  Lab=Lab collect   01/19/23 0909              Home Health: None Equipment/Devices: Home oxygen  Discharge Condition: Stable CODE STATUS: Full code Diet recommendation: Cardiac  Subjective: Seen and examined.  Feels better and improved every day.  Brief/Interim Summary: 52 year old female with past medical history of asthma and COPD, gastroesophageal reflux disease who presented to [facility] emergency department with a 3-day history of increasing shortness of breath and cough.    Reportedly, patient is supposed to use oxygen as needed however she often does not use it.  Upon evaluation, patient was found to be in acute hypoxic respiratory failure requiring initiation of supplemental oxygen.  Felt to be suffering from an exacerbation of her asthma and  COPD.  Patient was initiated on intravenous Solu-Medrol as well as bronchodilator therapy and the hospitalist group was then called to assess the patient for admission to the hospital.   Patient experienced extremely slow clinical recovery throughout the hospitalization.  Patient was treated with a combination of high-dose steroids and aggressive bronchodilator therapy initially.  Several days into the hospitalization doxycycline was added.  On hospital day 3 repeat x-ray brought about concern for progressive infiltrates and therefore intravenous ceftriaxone was added.  On hospital day 4 CT imaging of the chest with contrast was performed revealing diffuse groundglass infiltrates and therefore on hospital day 5 pulmonology was consulted.     Pulmonology felt that the patient's complicated presentation may be secondary to a combination of asthma, bronchiolitis and mucous plugs.  They recommended addition of Pulmicort twice daily for 48 hours, Breo daily and close monitoring.  They recommended that after discharge the patient should undergo a repeat CT chest in 4 to 6 weeks.  Patient has now improved significantly.  She is requiring 1 L of oxygen with rest and 2 L with exertion.  She in the past has been advised to use oxygen as needed.  She already has oxygen at home.  She is in agreement with discharging home today.  Per pulmonology recommendation, she has been prescribed Incruse as well as tapering dose of prednisone over the course of next 12 days.  She will follow-up with her PCP as well as pulmonology.  Nicotine dependence/cigarettes: She says that she stopped 2 weeks ago.  Counseling provided to continue to stay away from smoking.  I especially informed her that if she were to resume smoking or she were to have secondhand smoking exposure from her husband, she may end up having COPD exacerbation again and may need to be hospitalized again.  She verbalized understanding.   GERD without esophagitis:  Continue Protonix.   Mixed hyperlipidemia: Statin.  Discharge plan was discussed with patient and/or family member and they verbalized understanding and agreed with it.  Discharge Diagnoses:  Principal Problem:   Acute exacerbation of COPD with asthma (HCC) Active Problems:   Acute on chronic respiratory failure with hypoxia (HCC)   Nicotine dependence, cigarettes, uncomplicated   GERD without esophagitis   Mixed hyperlipidemia   Hypoxia    Discharge Instructions   Allergies as of 01/21/2023       Reactions   Aspirin Shortness Of Breath, Itching, Nausea And Vomiting, Other (See Comments)   Also: abdominal pain, GI Intolerance Per the patient, she has aspirin-sensitive asthma        Medication List     STOP taking these medications    azithromycin 250 MG tablet Commonly known as: ZITHROMAX   benzonatate 100 MG capsule Commonly known as: TESSALON   cetirizine 10 MG tablet Commonly known as: ZyrTEC Allergy   cloNIDine 0.1 MG tablet Commonly known as: CATAPRES       TAKE these medications    Advair Diskus 250-50 MCG/ACT Aepb Generic drug: fluticasone-salmeterol Inhale 1 puff into the lungs in the morning and at bedtime.   albuterol (2.5 MG/3ML) 0.083% nebulizer solution Commonly known as: PROVENTIL Take 3 mLs (2.5 mg total) by nebulization every 6 (six) hours as needed for wheezing. What changed: Another medication with the same name was changed. Make sure you understand how and when to take each.   albuterol 108 (90 Base) MCG/ACT inhaler Commonly known as: VENTOLIN HFA Inhale 2 puffs into the lungs every 6 (six) hours as needed for wheezing or shortness of breath. Please dispense PROAIR What changed:  when to take this additional instructions   dicyclomine 20 MG tablet Commonly known as: BENTYL Take 20 mg by mouth in the morning, at noon, and at bedtime.   FLUoxetine 40 MG capsule Commonly known as: PROZAC Take 80 mg by mouth daily.    fluticasone 50 MCG/ACT nasal spray Commonly known as: FLONASE Place 1 spray into both nostrils daily.   LORazepam 1 MG tablet Commonly known as: ATIVAN Take 1 mg by mouth See admin instructions. Take 1 mg by mouth in the morning & 2 PM- and an additional 1 mg at bedtime as needed for anxiety or sleep   mirtazapine 30 MG tablet Commonly known as: REMERON Take 45 mg by mouth at bedtime.   naproxen sodium 220 MG tablet Commonly known as: ALEVE Take 440 mg by mouth 2 (two) times daily as needed (pain).   OXYGEN Inhale 2-3 L/min into the lungs as needed (for shortness of breath).   pantoprazole 40 MG tablet Commonly known as: PROTONIX Take 40 mg by mouth daily before breakfast.   predniSONE 10 MG tablet Commonly known as: DELTASONE Take 4 tablets (40 mg total) by mouth daily for 3 days, THEN 3 tablets (30 mg total) daily for 3 days, THEN 2 tablets (20 mg total) daily for 3 days, THEN 1 tablet (10 mg total) daily for 3 days. Start taking on: January 21, 2023 What changed:  medication  strength See the new instructions.   PROBIOTIC PO Take 1 capsule by mouth daily.   risperiDONE 1 MG tablet Commonly known as: RISPERDAL Take 1 mg by mouth in the morning and at bedtime.   umeclidinium bromide 62.5 MCG/ACT Aepb Commonly known as: INCRUSE ELLIPTA Inhale 1 puff into the lungs daily. Start taking on: January 22, 2023               Durable Medical Equipment  (From admission, onward)           Start     Ordered   01/21/23 1107  For home use only DME oxygen  Once       Question Answer Comment  Length of Need Lifetime   Mode or (Route) Nasal cannula   Liters per Minute 2   Frequency Continuous (stationary and portable oxygen unit needed)   Oxygen delivery system Gas      01/21/23 1106            Follow-up Information     [facility] Follow up in 1 week(s).   Contact information: [address] [place] [address] [phone]          Primary pulmonologist Follow up in 1 month(s).   Why: Needs repeat CT chest in 4 to 6 weeks.               Allergies  Allergen Reactions   Aspirin Shortness Of Breath, Itching, Nausea And Vomiting and Other (See Comments)    Also: abdominal pain, GI Intolerance Per the patient, she has aspirin-sensitive asthma    Consultations: Pulmonology   Procedures/Studies: DG CHEST PORT 1 VIEW  Result Date: 01/21/2023 CLINICAL DATA:  Shortness of breath. EXAM: PORTABLE CHEST 1 VIEW COMPARISON:  Chest radiographs 12/27/2017, 11/13/2019, 01/13/2023, 01/16/2023; CT chest 01/17/2023 FINDINGS: Cardiac silhouette and mediastinal contours are within normal limits. The patient is again mildly rightward rotated. Bilateral lower lung interstitial thickening is similar to 01/16/2023 and 01/13/2023. The upper lungs are clear. On recent CT this was seen as patchy ground-glass opacities with central predominance. No pleural effusion or pneumothorax is seen. Mild multilevel degenerative disc changes of the thoracic spine. IMPRESSION: Bilateral lower lung interstitial thickening and patchy opacities are similar to most recent radiographs. Again this suggests an infectious/inflammatory process, possibly aspiration, pneumonia, atypical pneumonia, or interstitial pneumonia. Electronically Signed   By: Neita Garnet M.D.   On: 01/21/2023 11:28   CT CHEST W CONTRAST  Result Date: 01/17/2023 CLINICAL DATA:  Persisting hypoxic respiratory failure in a patient with COPD and pneumonia despite appropriate treatment EXAM: CT CHEST WITH CONTRAST TECHNIQUE: Multidetector CT imaging of the chest was performed during intravenous contrast administration. RADIATION DOSE REDUCTION: This exam was performed according to the departmental dose-optimization program which includes automated exposure control, adjustment of the mA and/or kV according to patient size and/or use of iterative reconstruction technique. CONTRAST:   OMNIPAQUE IOHEXOL 300 MG/ML  SOLN COMPARISON:  Chest radiograph yesterday FINDINGS: Cardiovascular: The heart is normal in size. No pericardial effusion. Mild aortic atherosclerosis without aneurysm. No central pulmonary embolus on this exam not tailored to pulmonary artery assessment. Mediastinum/Nodes: Scattered small mediastinal lymph nodes, none of which are enlarged by size criteria. Borderline high right hilar node at 9 mm. Moderate-sized hiatal hernia with mild wall thickening of the distal esophagus. Lungs/Pleura: Multifocal geographic ground-glass and linear opacities within both lungs. Occasional areas of consolidation. Central predominant distribution but no apical basilar gradient. There is moderate central bronchial  thickening. Occasional mucoid impaction in both lower lobes. No dominant pulmonary mass. No pleural effusion. Upper Abdomen: No acute findings.  Cholecystectomy. Musculoskeletal: There are no acute or suspicious osseous abnormalities. No chest wall soft tissue abnormalities. IMPRESSION: 1. Multifocal geographic ground-glass and linear opacities within both lungs, with a central predominant distribution. Findings are nonspecific, and may be infectious or inflammatory. The possibility of interstitial pneumonia or other atypical pneumonias are considered. 2. Moderate central bronchial thickening with occasional mucoid impaction in both lower lobes. 3. Moderate-sized hiatal hernia with mild wall thickening of the distal esophagus, can be seen with reflux or esophagitis. Aortic Atherosclerosis (ICD10-I70.0). Electronically Signed   By: Narda Rutherford M.D.   On: 01/17/2023 16:44   DG Chest 1 View  Result Date: 01/16/2023 CLINICAL DATA:  52 year old female with history of shortness of breath. EXAM: CHEST  1 VIEW COMPARISON:  Chest x-ray 01/13/2023. FINDINGS: Increased coarse interstitial markings and peribronchial cuffing throughout the mid to lower lungs bilaterally. No confluent  consolidative airspace disease. No pleural effusions. No pneumothorax. No evidence of pulmonary edema. Heart size is normal. The patient is rotated to the right on today's exam, resulting in distortion of the mediastinal contours and reduced diagnostic sensitivity and specificity for mediastinal pathology. IMPRESSION: 1. Findings throughout the mid to lower lungs bilaterally appear worsened compared to the recent prior study. This could reflect sequela of recent aspiration, or could reflect bronchitis and developing multilobar bilateral bronchopneumonia. Electronically Signed   By: Trudie Reed M.D.   On: 01/16/2023 06:43   DG Chest 1 View  Result Date: 01/13/2023 CLINICAL DATA:  Shortness of breath for 1 week. EXAM: CHEST  1 VIEW COMPARISON:  11/13/2019 FINDINGS: Heart size is normal. Low lung volumes are seen. Bibasilar infiltrates or atelectasis are new since previous study. No evidence pleural effusion. IMPRESSION: Low lung volumes, with bibasilar infiltrates or atelectasis. Electronically Signed   By: Danae Orleans M.D.   On: 01/13/2023 11:30     Discharge Exam: Vitals:   01/21/23 0437 01/21/23 0742  BP: 123/80   Pulse: 89   Resp: 18   Temp: 98.3 F (36.8 C)   SpO2: 94% 94%   Vitals:   01/20/23 2017 01/20/23 2051 01/21/23 0437 01/21/23 0742  BP: 104/76  123/80   Pulse: 100  89   Resp: 18  18   Temp: 98.1 F (36.7 C)  98.3 F (36.8 C)   TempSrc: Oral  Oral   SpO2: 94% 92% 94% 94%  Weight:      Height:        General: Pt is alert, awake, not in acute distress Cardiovascular: RRR, S1/S2 +, no rubs, no gallops Respiratory: Expiratory wheezes, improving and better than yesterday. Abdominal: Soft, NT, ND, bowel sounds + Extremities: no edema, no cyanosis    The results of significant diagnostics from this hospitalization (including imaging, microbiology, ancillary and laboratory) are listed below for reference.     Microbiology: Recent Results (from the past 240 hour(s))   SARS Coronavirus 2 by RT PCR (hospital order, performed in [facility] hospital lab) *cepheid single result test* Anterior Nasal Swab     Status: None   Collection Time: 01/13/23  3:22 AM   Specimen: Anterior Nasal Swab  Result Value Ref Range Status   SARS Coronavirus 2 by RT PCR NEGATIVE NEGATIVE Final    Comment: (NOTE) SARS-CoV-2 target nucleic acids are NOT DETECTED.  The SARS-CoV-2 RNA is generally detectable in upper and lower respiratory specimens during the acute  phase of infection. The lowest concentration of SARS-CoV-2 viral copies this assay can detect is 250 copies / mL. A negative result does not preclude SARS-CoV-2 infection and should not be used as the sole basis for treatment or other patient management decisions.  A negative result may occur with improper specimen collection / handling, submission of specimen other than nasopharyngeal swab, presence of viral mutation(s) within the areas targeted by this assay, and inadequate number of viral copies (<250 copies / mL). A negative result must be combined with clinical observations, patient history, and epidemiological information.  Fact Sheet for Patients:   RoadLapTop.co.za  Fact Sheet for Healthcare Providers: http://kim-miller.com/  This test is not yet approved or  cleared by the [place] FDA and has been authorized for detection and/or diagnosis of SARS-CoV-2 by FDA under an Emergency Use Authorization (EUA).  This EUA will remain in effect (meaning this test can be used) for the duration of the COVID-19 declaration under Section 564(b)(1) of the Act, 21 U.S.C. section 360bbb-3(b)(1), unless the authorization is terminated or revoked sooner.  Performed at [facility], [address], [place], [place] [address]   Respiratory (~20 pathogens) panel by PCR     Status: None   Collection Time: 01/17/23  1:12 PM   Specimen: Nasopharyngeal Swab;  Respiratory  Result Value Ref Range Status   Adenovirus NOT DETECTED NOT DETECTED Final   Coronavirus 229E NOT DETECTED NOT DETECTED Final    Comment: (NOTE) The Coronavirus on the Respiratory Panel, DOES NOT test for the novel  Coronavirus (2019 nCoV)    Coronavirus HKU1 NOT DETECTED NOT DETECTED Final   Coronavirus NL63 NOT DETECTED NOT DETECTED Final   Coronavirus OC43 NOT DETECTED NOT DETECTED Final   Metapneumovirus NOT DETECTED NOT DETECTED Final   Rhinovirus / Enterovirus NOT DETECTED NOT DETECTED Final   Influenza A NOT DETECTED NOT DETECTED Final   Influenza B NOT DETECTED NOT DETECTED Final   Parainfluenza Virus 1 NOT DETECTED NOT DETECTED Final   Parainfluenza Virus 2 NOT DETECTED NOT DETECTED Final   Parainfluenza Virus 3 NOT DETECTED NOT DETECTED Final   Parainfluenza Virus 4 NOT DETECTED NOT DETECTED Final   Respiratory Syncytial Virus NOT DETECTED NOT DETECTED Final   Bordetella pertussis NOT DETECTED NOT DETECTED Final   Bordetella Parapertussis NOT DETECTED NOT DETECTED Final   Chlamydophila pneumoniae NOT DETECTED NOT DETECTED Final   Mycoplasma pneumoniae NOT DETECTED NOT DETECTED Final    Comment: Performed at [facility], [address] [place], [place] [address]     Labs: BNP (last 3 results) Recent Labs    01/15/23 0544  BNP 58.1   Basic Metabolic Panel: Recent Labs  Lab 01/15/23 0544 01/16/23 0539 01/18/23 0559 01/19/23 0549  NA 138 136 141 139  K 4.6 4.2 4.3 4.5  CL 99 97* 102 98  CO2 27 27 31 29   GLUCOSE 126* 168* 141* 137*  BUN 14 15 15 15   CREATININE 0.71 0.67 0.58 0.61  CALCIUM 9.2 8.8* 9.2 9.1  MG 2.3 2.2 2.2 2.2  PHOS  --   --  4.5 4.6   Liver Function Tests: Recent Labs  Lab 01/15/23 0544 01/16/23 0539 01/18/23 0559 01/19/23 0549  AST 18 19 12* 17  ALT 26 27 21 22   ALKPHOS 100 103 96 103  BILITOT 0.4 0.2* 0.5 0.1*  PROT 6.8 6.4* 6.6 6.4*  ALBUMIN 3.0* 2.9* 2.8* 2.9*   No results for input(s): "LIPASE", "AMYLASE"  in  the last 168 hours. No results for input(s): "AMMONIA" in the last 168 hours. CBC: Recent Labs  Lab 01/15/23 0544 01/16/23 0539 01/18/23 0559 01/19/23 0549  WBC 12.2* 9.1 9.5 10.8*  NEUTROABS 10.5* 8.0* 8.0* 9.5*  HGB 11.2* 10.7* 11.1* 11.3*  HCT 37.7 35.8* 36.5 37.2  MCV 89.8 89.1 88.0 87.9  PLT 384 379 409* 404*   Cardiac Enzymes: No results for input(s): "CKTOTAL", "CKMB", "CKMBINDEX", "TROPONINI" in the last 168 hours. BNP: Invalid input(s): "POCBNP" CBG: No results for input(s): "GLUCAP" in the last 168 hours. D-Dimer No results for input(s): "DDIMER" in the last 72 hours. Hgb A1c No results for input(s): "HGBA1C" in the last 72 hours. Lipid Profile No results for input(s): "CHOL", "HDL", "LDLCALC", "TRIG", "CHOLHDL", "LDLDIRECT" in the last 72 hours. Thyroid function studies No results for input(s): "TSH", "T4TOTAL", "T3FREE", "THYROIDAB" in the last 72 hours.  Invalid input(s): "FREET3" Anemia work up No results for input(s): "VITAMINB12", "FOLATE", "FERRITIN", "TIBC", "IRON", "RETICCTPCT" in the last 72 hours. Urinalysis    Component Value Date/Time   COLORURINE YELLOW 12/27/2017 2325   APPEARANCEUR CLEAR 12/27/2017 2325   LABSPEC 1.008 12/27/2017 2325   PHURINE 6.0 12/27/2017 2325   GLUCOSEU NEGATIVE 12/27/2017 2325   HGBUR NEGATIVE 12/27/2017 2325   BILIRUBINUR negative 12/25/2019 1238   KETONESUR negative 12/25/2019 1238   KETONESUR NEGATIVE 12/27/2017 2325   PROTEINUR negative 12/25/2019 1238   PROTEINUR NEGATIVE 12/27/2017 2325   UROBILINOGEN 0.2 12/25/2019 1238   UROBILINOGEN 0.2 04/10/2007 0716   NITRITE Negative 12/25/2019 1238   NITRITE NEGATIVE 12/27/2017 2325   LEUKOCYTESUR Negative 12/25/2019 1238   Sepsis Labs Recent Labs  Lab 01/15/23 0544 01/16/23 0539 01/18/23 0559 01/19/23 0549  WBC 12.2* 9.1 9.5 10.8*   Microbiology Recent Results (from the past 240 hour(s))  SARS Coronavirus 2 by RT PCR (hospital order, performed in [facility] hospital lab) *cepheid single result test* Anterior Nasal Swab     Status: None   Collection Time: 01/13/23  3:22 AM   Specimen: Anterior Nasal Swab  Result Value Ref Range Status   SARS Coronavirus 2 by RT PCR NEGATIVE NEGATIVE Final    Comment: (NOTE) SARS-CoV-2 target nucleic acids are NOT DETECTED.  The SARS-CoV-2 RNA is generally detectable in upper and lower respiratory specimens during the acute phase of infection. The lowest concentration of SARS-CoV-2 viral copies this assay can detect is 250 copies / mL. A negative result does not preclude SARS-CoV-2 infection and should not be used as the sole basis for treatment or other patient management decisions.  A negative result may occur with improper specimen collection / handling, submission of specimen other than nasopharyngeal swab, presence of viral mutation(s) within the areas targeted by this assay, and inadequate number of viral copies (<250 copies / mL). A negative result must be combined with clinical observations, patient history, and epidemiological information.  Fact Sheet for Patients:   RoadLapTop.co.za  Fact Sheet for Healthcare Providers: http://kim-miller.com/  This test is not yet approved or  cleared by the [place] FDA and has been authorized for detection and/or diagnosis of SARS-CoV-2 by FDA under an Emergency Use Authorization (EUA).  This EUA will remain in effect (meaning this test can be used) for the duration of the COVID-19 declaration under Section 564(b)(1) of the Act, 21 U.S.C. section 360bbb-3(b)(1), unless the authorization is terminated or revoked sooner.  Performed at [facility], [address] [place], [place] [address]   Respiratory (~20 pathogens) panel by  PCR     Status: None   Collection Time: 01/17/23  1:12 PM   Specimen: Nasopharyngeal Swab; Respiratory  Result Value Ref Range Status   Adenovirus NOT  DETECTED NOT DETECTED Final   Coronavirus 229E NOT DETECTED NOT DETECTED Final    Comment: (NOTE) The Coronavirus on the Respiratory Panel, DOES NOT test for the novel  Coronavirus (2019 nCoV)    Coronavirus HKU1 NOT DETECTED NOT DETECTED Final   Coronavirus NL63 NOT DETECTED NOT DETECTED Final   Coronavirus OC43 NOT DETECTED NOT DETECTED Final   Metapneumovirus NOT DETECTED NOT DETECTED Final   Rhinovirus / Enterovirus NOT DETECTED NOT DETECTED Final   Influenza A NOT DETECTED NOT DETECTED Final   Influenza B NOT DETECTED NOT DETECTED Final   Parainfluenza Virus 1 NOT DETECTED NOT DETECTED Final   Parainfluenza Virus 2 NOT DETECTED NOT DETECTED Final   Parainfluenza Virus 3 NOT DETECTED NOT DETECTED Final   Parainfluenza Virus 4 NOT DETECTED NOT DETECTED Final   Respiratory Syncytial Virus NOT DETECTED NOT DETECTED Final   Bordetella pertussis NOT DETECTED NOT DETECTED Final   Bordetella Parapertussis NOT DETECTED NOT DETECTED Final   Chlamydophila pneumoniae NOT DETECTED NOT DETECTED Final   Mycoplasma pneumoniae NOT DETECTED NOT DETECTED Final    Comment: Performed at [facility], [address] [place], [place] [address]    FURTHER DISCHARGE INSTRUCTIONS:   Get Medicines reviewed and adjusted: Please take all your medications with you for your next visit with your Primary MD   Laboratory/radiological data: Please request your Primary MD to go over all hospital tests and procedure/radiological results at the follow up, please ask your Primary MD to get all Hospital records sent to his/her office.   In some cases, they will be blood work, cultures and biopsy results pending at the time of your discharge. Please request that your primary care M.D. goes through all the records of your hospital data and follows up on these results.   Also Note the following: If you experience worsening of your admission symptoms, develop shortness of breath, life threatening emergency,  suicidal or homicidal thoughts you must seek medical attention immediately by calling 911 or calling your MD immediately  if symptoms less severe.   You must read complete instructions/literature along with all the possible adverse reactions/side effects for all the Medicines you take and that have been prescribed to you. Take any new Medicines after you have completely understood and accpet all the possible adverse reactions/side effects.    Do not drive when taking Pain medications or sleeping medications (Benzodaizepines)   Do not take more than prescribed Pain, Sleep and Anxiety Medications. It is not advisable to combine anxiety,sleep and pain medications without talking with your primary care practitioner   Special Instructions: If you have smoked or chewed Tobacco  in the last 2 yrs please stop smoking, stop any regular Alcohol  and or any Recreational drug use.   Wear Seat belts while driving.   Please note: You were cared for by a hospitalist during your hospital stay. Once you are discharged, your primary care physician will handle any further medical issues. Please note that NO REFILLS for any discharge medications will be authorized once you are discharged, as it is imperative that you return to your primary care physician (or establish a relationship with a primary care physician if you do not have one) for your post hospital discharge needs so that they can reassess your need for medications and  monitor your lab values  Time coordinating discharge: Over 30 minutes  SIGNED:   Hughie Closs, MD  [facility] 01/21/2023, 12:37 PM *Please note that this is a verbal dictation therefore any spelling or grammatical errors are due to the "[employer] One" system interpretation. If 7PM-7AM, please contact night-coverage [email or web address]

## 2023-01-21 NOTE — TOC CM/SW Note (Signed)
Transition of Care [facility]) - Inpatient Brief Assessment   Patient Details  Name: VERNALEE SCHULTS MRN: [id number] Date of Birth: 1970-12-02  Transition of Care [facility]) CM/SW Contact:    Howell Rucks, RN Phone Number: 01/21/2023, 10:47 AM   Clinical Narrative: Met with pt at bedside to introduce role of TOC/NCM and review for dc planning  Pt reports she has an establishded PCP and pharmacy, no current home care services, home DME: Home 02 through [employer], home nebulizer, pt reports she feels safe returning home with support from her spouse, spouse will provide transportation at discharge. Spouse will bring portable 02 from home for discharge.  TOC Brief Assessment completed. No current TOC needs identified.     Transition of Care Asessment: Insurance and Status: Insurance coverage has been reviewed Patient has primary care physician: Yes Home environment has been reviewed: resides in private residence with spouse Prior level of function:: Independent Prior/Current Home Services: No current home services Social Determinants of Health Reivew: SDOH reviewed no interventions necessary Readmission risk has been reviewed: Yes Transition of care needs: no transition of care needs at this time

## 2023-01-21 NOTE — Discharge Instructions (Signed)
Please ask your PCP or your pulmonologist to arrange repeat CT scan of the chest in 4 to 6 weeks to follow-up on groundglass opacities.

## 2023-01-21 NOTE — Progress Notes (Signed)
SATURATION QUALIFICATIONS: (This note is used to comply with regulatory documentation for home oxygen)  Patient Saturations on Room Air at Rest = 93%  Patient Saturations on Room Air while Ambulating = 89%  Patient Saturations on 2 Liters of oxygen while Ambulating = 92%
